# Patient Record
Sex: Male | Born: 1970 | Race: White | Hispanic: No | Marital: Single | Smoking: Never smoker
Health system: Southern US, Community
[De-identification: ages and names within clinical notes are randomized; demographics above are authoritative.]

## PROBLEM LIST (undated history)

## (undated) DIAGNOSIS — E291 Testicular hypofunction: Secondary | ICD-10-CM

## (undated) DIAGNOSIS — L309 Dermatitis, unspecified: Secondary | ICD-10-CM

## (undated) DIAGNOSIS — Z973 Presence of spectacles and contact lenses: Secondary | ICD-10-CM

## (undated) DIAGNOSIS — K219 Gastro-esophageal reflux disease without esophagitis: Secondary | ICD-10-CM

## (undated) DIAGNOSIS — R0602 Shortness of breath: Secondary | ICD-10-CM

## (undated) DIAGNOSIS — N529 Male erectile dysfunction, unspecified: Secondary | ICD-10-CM

## (undated) DIAGNOSIS — Z86711 Personal history of pulmonary embolism: Secondary | ICD-10-CM

## (undated) DIAGNOSIS — Z8739 Personal history of other diseases of the musculoskeletal system and connective tissue: Secondary | ICD-10-CM

## (undated) DIAGNOSIS — T7840XA Allergy, unspecified, initial encounter: Secondary | ICD-10-CM

## (undated) DIAGNOSIS — M1990 Unspecified osteoarthritis, unspecified site: Secondary | ICD-10-CM

## (undated) DIAGNOSIS — G8929 Other chronic pain: Secondary | ICD-10-CM

## (undated) DIAGNOSIS — Z86718 Personal history of other venous thrombosis and embolism: Secondary | ICD-10-CM

## (undated) DIAGNOSIS — I82409 Acute embolism and thrombosis of unspecified deep veins of unspecified lower extremity: Secondary | ICD-10-CM

## (undated) DIAGNOSIS — R202 Paresthesia of skin: Secondary | ICD-10-CM

## (undated) DIAGNOSIS — Z7901 Long term (current) use of anticoagulants: Secondary | ICD-10-CM

## (undated) HISTORY — DX: Paresthesia of skin: R20.2

## (undated) HISTORY — DX: Male erectile dysfunction, unspecified: N52.9

## (undated) HISTORY — PX: KNEE ARTHROSCOPY: SUR90

## (undated) HISTORY — DX: Testicular hypofunction: E29.1

## (undated) HISTORY — DX: Allergy, unspecified, initial encounter: T78.40XA

---
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
## 1998-08-06 ENCOUNTER — Ambulatory Visit (HOSPITAL_COMMUNITY): Admission: RE | Admit: 1998-08-06 | Discharge: 1998-08-06 | Payer: Self-pay

## 2000-02-28 ENCOUNTER — Ambulatory Visit (HOSPITAL_COMMUNITY): Admission: RE | Admit: 2000-02-28 | Discharge: 2000-02-28 | Payer: Self-pay | Admitting: Urology

## 2000-02-28 ENCOUNTER — Other Ambulatory Visit: Admission: RE | Admit: 2000-02-28 | Discharge: 2000-02-28 | Payer: Self-pay | Admitting: Urology

## 2000-02-28 ENCOUNTER — Encounter (INDEPENDENT_AMBULATORY_CARE_PROVIDER_SITE_OTHER): Payer: Self-pay | Admitting: Specialist

## 2004-07-24 HISTORY — PX: MENISECTOMY: SHX5181

## 2005-06-30 ENCOUNTER — Encounter: Admission: RE | Admit: 2005-06-30 | Discharge: 2005-06-30 | Payer: Self-pay | Admitting: Family Medicine

## 2005-07-24 DIAGNOSIS — Z86711 Personal history of pulmonary embolism: Secondary | ICD-10-CM

## 2005-07-24 HISTORY — DX: Personal history of pulmonary embolism: Z86.711

## 2005-08-23 ENCOUNTER — Inpatient Hospital Stay (HOSPITAL_COMMUNITY): Admission: AD | Admit: 2005-08-23 | Discharge: 2005-08-25 | Payer: Self-pay | Admitting: Orthopedic Surgery

## 2005-09-18 ENCOUNTER — Encounter: Admission: RE | Admit: 2005-09-18 | Discharge: 2005-09-18 | Payer: Self-pay | Admitting: Family Medicine

## 2006-01-18 ENCOUNTER — Ambulatory Visit: Payer: Self-pay | Admitting: Family Medicine

## 2006-02-05 ENCOUNTER — Ambulatory Visit: Payer: Self-pay | Admitting: Family Medicine

## 2006-03-02 ENCOUNTER — Ambulatory Visit: Payer: Self-pay | Admitting: Family Medicine

## 2006-10-22 ENCOUNTER — Ambulatory Visit: Payer: Self-pay | Admitting: Family Medicine

## 2006-11-15 ENCOUNTER — Ambulatory Visit: Payer: Self-pay | Admitting: Family Medicine

## 2006-11-30 ENCOUNTER — Ambulatory Visit: Payer: Self-pay | Admitting: Family Medicine

## 2007-06-18 ENCOUNTER — Ambulatory Visit: Payer: Self-pay | Admitting: Family Medicine

## 2007-06-24 ENCOUNTER — Encounter: Admission: RE | Admit: 2007-06-24 | Discharge: 2007-06-24 | Payer: Self-pay | Admitting: Family Medicine

## 2007-06-24 ENCOUNTER — Ambulatory Visit: Payer: Self-pay | Admitting: Family Medicine

## 2007-12-17 ENCOUNTER — Ambulatory Visit: Payer: Self-pay | Admitting: Family Medicine

## 2008-01-13 ENCOUNTER — Ambulatory Visit: Payer: Self-pay | Admitting: Family Medicine

## 2008-09-29 ENCOUNTER — Ambulatory Visit: Payer: Self-pay | Admitting: Family Medicine

## 2009-08-18 ENCOUNTER — Ambulatory Visit: Payer: Self-pay | Admitting: Family Medicine

## 2009-09-21 ENCOUNTER — Ambulatory Visit: Payer: Self-pay | Admitting: Family Medicine

## 2010-04-04 ENCOUNTER — Ambulatory Visit: Payer: Self-pay | Admitting: Family Medicine

## 2010-04-05 ENCOUNTER — Encounter: Admission: RE | Admit: 2010-04-05 | Discharge: 2010-04-05 | Payer: Self-pay | Admitting: Family Medicine

## 2010-04-12 ENCOUNTER — Ambulatory Visit: Payer: Self-pay | Admitting: Family Medicine

## 2010-09-01 ENCOUNTER — Ambulatory Visit (INDEPENDENT_AMBULATORY_CARE_PROVIDER_SITE_OTHER): Payer: Managed Care, Other (non HMO) | Admitting: Family Medicine

## 2010-09-01 DIAGNOSIS — J0100 Acute maxillary sinusitis, unspecified: Secondary | ICD-10-CM

## 2010-12-09 NOTE — Discharge Summary (Signed)
NAME:  Jonathan Archer, Jonathan Archer NO.:  [id number]   MEDICAL RECORD NO.:  [id number]          PATIENT TYPE:  INP   LOCATION:  5011                         FACILITY:  [facility]   PHYSICIAN:  Elliot Cousin, M.D.    DATE OF BIRTH:  [birth year]   DATE OF ADMISSION:  08/23/2005  DATE OF DISCHARGE:  08/25/2005                                 DISCHARGE SUMMARY   DISCHARGE DIAGNOSES:  1.  Left lower extremity deep venous thrombosis.  2.  Right upper lobe segmental pulmonary embolism per CT scan of the chest      on August 23, 2005.  3.  Status post arthroscopic knee surgery approximately 1 week ago for      treatment of a torn meniscus.  4.  Elevated homocystine level.   DISCHARGE MEDICATIONS:  1.  Lovenox 85 mg subcutaneous every 12 hours.  2.  Coumadin 5 mg 1 pill every evening at 6:00 plus a 2.5 mg p.o. every      evening at 6:00 for a total of 7.5 mg each evening.  3.  Multivitamin once daily.  4.  Percocet 5 mg every 6 hours as needed for pain.   DISCHARGE DISPOSITION:  The patient was discharged to home in improved and  stable condition on August 25, 2005. He has an appointment for blood work  at Dr. Jola Babinski office on Monday, August 28, 2005, at 10:15 a.m. He will  also follow up with Dr. Susann Givens in the office for hospital follow up on  August 02, 2005. He will follow up with Dr. Rennis Chris as previously scheduled.   PROCEDURES PERFORMED:  None during the hospital course, however, prior to  the hospital admission, an outpatient venous Doppler study revealed  extensive calf DVT in the posterior tibial and peroneal veins from the  distal to the proximal calf on August 23, 2005. Also, CT scan of the chest  with contrast performed on August 23, 2005 revealed right upper lobe  segmental pulmonary embolism.   HISTORY OF PRESENT ILLNESS:  The patient is a 40 year old man with a past  medical history significant for a torn left knee meniscus who presented to  Dr. Dub Mikes  office for a chief complaint of right-sided chest pain and  shortness of breath along with increased left leg pain and swelling. The  patient's orthopedic surgeon, Dr. Rennis Chris, ordered an outpatient CT scan of  the chest and a lower extremity venous Doppler study. The CT scan of the  chest was positive for PE and the venous Doppler study was positive for a  left lower extremity DVT. The patient was subsequently admitted by the  [facility] for further management.   HOSPITAL COURSE:  1.  PE/DVT. The patient was started on Lovenox 1 mg per kg subcu q.12h. and      Coumadin. The pharmacy staff was consulted for additional      recommendations regarding dosing of the anticoagulants. The patient's      INR and PT were monitored daily. The dose of Coumadin was adjusted      accordingly. The patient  was started on oxygen therapy as needed. The      patient's initial complaints of chest pain and shortness of breath      resolved. He did complain of intermittent left lower extremity pain      primarily with ambulation. The pain was less intensified at rest. As      needed, Vicodin and warm compresses were ordered for discomfort. A      homocystine level was assessed and found to be slightly elevated. The      patient was therefore started on folic acid once daily. He will be      discharged to home on one multivitamin daily. Over the course of the      hospitalization, the left knee became less swollen and less tender. His      INR on admission was 1 and his INR as of today is 1.2. The patient      received 7.5 mg of Coumadin yesterday and will receive 7.5 mg of      Coumadin today. In addition, he is being given 85 mg of Lovenox subcu      q.12h.   The patient requested ongoing outpatient treatment rather than staying in  the hospital. Given that the patient was hemodynamically stable throughout  the entire hospital course, it was deemed reasonable to discharge the  patient to  home on Lovenox and Coumadin therapy with close follow up with  Dr. Susann Givens. The patient's oxygen saturation ranged from 95% to 100% on room  air during the hospital course.  His blood pressures were well within normal  limits. He was relatively bradycardic; however, the patient is an athlete.   The patient was given instructions on Lovenox therapy by the registered  nurse and the pharmacist. The patient demonstrated excellent understanding  of the technique of self administration of Lovenox. He was advised to take  7.5 mg of Coumadin each evening. He was instructed to follow up with Dr.  Jola Babinski office in 3 days for reassessment of his PT and INR. The patient  will see Dr. Susann Givens in one week as well.  He will be treated for pain with  as needed Percocet. The patient was advised to not to return to work until  he follows up with his primary care physician in 1 week.      Elliot Cousin, M.D.  Electronically Signed     DF/MEDQ  D:  08/25/2005  T:  08/25/2005  Job:  [id number]   cc:   Sharlot Gowda, M.D.  Fax: [phone]   Vania Rea. Supple, M.D.  Fax: [phone]

## 2010-12-09 NOTE — H&P (Signed)
NAME:  Jonathan Archer, Jonathan Archer                 ACCOUNT NO.:  [id number]   MEDICAL RECORD NO.:  [id number]          PATIENT TYPE:  INP   LOCATION:  5011                         FACILITY:  [facility]   PHYSICIAN:  Lonia Blood, M.D.      DATE OF BIRTH:  1971-05-23   DATE OF ADMISSION:  08/23/2005  DATE OF DISCHARGE:                                HISTORY & PHYSICAL   PRIMARY CARE PHYSICIAN:  Sharlot Gowda, M.D.   PRESENTING COMPLAINT:  Right-sided chest pain and shortness of breath.   HISTORY OF PRESENT ILLNESS:  The patient is a 40 year old gentleman with no  significant past medical history except for some tendinitis.  The patient's  problem started last October when he injured his left knee.  He had an MRI  done in December that showed that he had torn meniscus.  He subsequently was  scheduled to have arthroscopic repair which was performed last week.  Postoperatively, the patient remained in bed for the most part at home for 6  days prior to returning to work.  On returning to work 2 days ago, the  patient started noticing pain in the left leg.  By yesterday, his leg was  getting swollen.  This morning he experienced sharp pain on the left side of  his chest.  Hence, he called his doctor's office.  He was sent over to get a  chest CT which showed PE.  He was subsequently sent to have lower extremity  Dopplers and admitted to the hospital for further management.  The patient  currently denied any chest pain.  No shortness of breath. No cough.  He  denied any fever.  His leg is painful but otherwise no other complaints.  He  denied any PND, orthopnea, and no diaphoresis.   PAST MEDICAL HISTORY:  Mainly that of tendinitis.   ALLERGIES:  The patient has no known drug allergies.   SOCIAL HISTORY:  The patient is divorced.  He has 3 kids, all boys, ages 32,  34, and 27.  Denied any tobacco use.  Occasionally drinks alcohol.  He works  for a [employer].  He is active otherwise.  The patient  has had 3  previous knee surgeries, the last one about 10 years ago, but never had any  problem.   FAMILY HISTORY:  Mom is in her 11s and dad also in the 27s, no medical  problems the patient can remember.  On his mom's side, history of diabetes  and hypertension.   REVIEW OF SYSTEMS:  A 10-point Review of Systems is performed and  essentially ash in HPI.   PHYSICAL EXAMINATION:  VITAL SIGNS:  On his exam today, the patient is  afebrile with temperature 97.6, blood pressure 123/77, pulse 58, respiratory  rate 16, O2 saturation 97% on room air.  GENERAL: The patient is alert and oriented in no acute distress and very  conversant.  HEENT: PERRLA.  EOMI.  NECK:  Supple.  No JVD, no lymphadenopathy.  RESPIRATORY:  Good air entry bilaterally.  No wheeze, no rales.  CARDIOVASCULAR: The patient has  regular rate and rhythm, no murmurs.  ABDOMEN:  Soft, nontender, with positive bowel sounds.  EXTREMITIES:  Left lower extremity shows mild swelling and warmth at the  calf region, otherwise no significant edema, cyanosis, or clubbing. The  patient has 2+ DP pulses.  NEUROLOGIC:  Exam was essentially nonfocal.   LABORATORY DATA:  Labs are currently pending.   Chest CT without contrast performed today, August 23, 2005, showed left  lung segmental pulmonary embolus.   Right lower extremity Dopplers also performed today showed extensive calf  DVT in the posterior tibial and peroneal veins from distal to proximal calf.   ASSESSMENT:  This is a young man, 40 years old, presenting with right lung  segmental pulmonary embolus arising from left calf deep vein thrombosis.  This seems to be a trick with deep vein thrombosis and pulmonary embolus,  especially with recent surgery.  The patient had no family history of  thromboembolic phenomena, and I doubt if this is genetic.   PLAN:  1.  PE: Will admit the patient and start him on anticoagulation.  The      patient is young and has family support,  and they are willing to take      care of Lovenox injections at home.  So I will put him on Lovenox 1      mg/kg subcutaneously twice daily.  We will trend patient, and hopefully      in a day or two, once we have a general idea of what his INR is, we will      send patient home with Lovenox.  In the meantime, we will also start him      on Coumadin concurrently and follow his PT/INR.  We will defer genetic      tests until after 6 months on Coumadin.  The patient can be taken off      Coumadin at that point.  Two weeks later, he can get blood work for      protein S and C.  We can also check factor V Leiden.  We can check lupus      anticoagulant, etc.  I will, however, go ahead and check a homocysteine      level as well.  2.  DVT: We will treat it the same way as the PE above.  We will try and      treat for possible Post-Phlebetic syndrome if it occurs.  In the      meantime, we may elevate the foot and try warm compress if needed.  3.  Status post arthroscopic knee repair: Once the patient is fully      anticoagulated, we may be able to resume his PT on the knee.  We will      keep him off his knee at this point.   Other measures will depend on initial results once we get the labs back.      Lonia Blood, M.D.  Electronically Signed     LG/MEDQ  D:  08/23/2005  T:  08/23/2005  Job:  [id number]   cc:   Sharlot Gowda, M.D.  Fax: [phone]

## 2011-03-03 ENCOUNTER — Encounter: Payer: Self-pay | Admitting: Family Medicine

## 2011-05-01 ENCOUNTER — Encounter: Payer: Self-pay | Admitting: Family Medicine

## 2011-05-01 ENCOUNTER — Ambulatory Visit (INDEPENDENT_AMBULATORY_CARE_PROVIDER_SITE_OTHER): Payer: Managed Care, Other (non HMO) | Admitting: Family Medicine

## 2011-05-01 VITALS — BP 120/70 | HR 47 | Ht 70.0 in | Wt 205.0 lb

## 2011-05-01 DIAGNOSIS — L723 Sebaceous cyst: Secondary | ICD-10-CM

## 2011-05-01 DIAGNOSIS — L089 Local infection of the skin and subcutaneous tissue, unspecified: Secondary | ICD-10-CM

## 2011-05-01 NOTE — Patient Instructions (Signed)
Keep the area clean and dry. Return here Wednesday for packing removal

## 2011-05-01 NOTE — Progress Notes (Signed)
  Subjective:    Patient ID: Jonathan Archer, male    DOB: Feb 05, 1971, 40 y.o.   MRN: [id number]  HPI He has a several day history of swelling and pain as well as drainage from a lesion on his mid upper back area. He had difficulty with this 3 years ago.   Review of Systems     Objective:   Physical Exam A 3 cm raised red tender lesion with central drainage is noted in the mid back area       Assessment & Plan:  Infected sebaceous cyst The wound was injected with Xylocaine. An I+D was performed without difficulty. Purulent material was expressed. The cyst sac was incised however there is a question of whether there still some material present. Was packed without form. He will return here in 2 days for packing removal.

## 2011-05-03 ENCOUNTER — Encounter: Payer: Self-pay | Admitting: Family Medicine

## 2011-05-03 ENCOUNTER — Ambulatory Visit (INDEPENDENT_AMBULATORY_CARE_PROVIDER_SITE_OTHER): Payer: Managed Care, Other (non HMO) | Admitting: Family Medicine

## 2011-05-03 VITALS — BP 110/70 | HR 50 | Wt 203.0 lb

## 2011-05-03 DIAGNOSIS — L02212 Cutaneous abscess of back [any part, except buttock]: Secondary | ICD-10-CM

## 2011-05-03 DIAGNOSIS — L02219 Cutaneous abscess of trunk, unspecified: Secondary | ICD-10-CM

## 2011-05-03 NOTE — Progress Notes (Signed)
  Subjective:    Patient ID: Jonathan Archer, male    DOB: September 11, 1970, 40 y.o.   MRN: [id number]  HPI He is here for recheck on recent I+D of an abscess.   Review of Systems     Objective:   Physical Exam The wound seems to be healing nicely. Packing was removed.       Assessment & Plan:  I and D. of infected cyst Heat the area clean and irrigate this and you daily.

## 2011-05-08 ENCOUNTER — Encounter: Payer: Self-pay | Admitting: Family Medicine

## 2011-05-08 ENCOUNTER — Ambulatory Visit (INDEPENDENT_AMBULATORY_CARE_PROVIDER_SITE_OTHER): Payer: Managed Care, Other (non HMO) | Admitting: Family Medicine

## 2011-05-08 VITALS — BP 120/80 | HR 60 | Wt 201.0 lb

## 2011-05-08 DIAGNOSIS — S9032XA Contusion of left foot, initial encounter: Secondary | ICD-10-CM

## 2011-05-08 DIAGNOSIS — S9030XA Contusion of unspecified foot, initial encounter: Secondary | ICD-10-CM

## 2011-05-08 NOTE — Progress Notes (Signed)
  Subjective:    Patient ID: Jonathan Archer, male    DOB: [birth year], 40 y.o.   MRN: [id number]  HPI He injured his left fifth toe while playing soccer over the weekend. It is now giving him difficulty with walking.   Review of Systems     Objective:   Physical Exam Left fifth toe is tender to palpation as well as motion. The x-ray is negative.      Assessment & Plan:  Foot contusion Supportive care including Advil and using hard soled shoe.

## 2011-05-08 NOTE — Patient Instructions (Signed)
Use Advil for pain relief. Use a hard soled shoe or even a wooden shoe if you have one. If you keep having trouble, come back

## 2011-05-25 ENCOUNTER — Other Ambulatory Visit: Payer: Self-pay | Admitting: Family Medicine

## 2011-05-26 NOTE — Telephone Encounter (Signed)
Is this okay to refill? 

## 2011-08-14 ENCOUNTER — Encounter: Payer: Self-pay | Admitting: Family Medicine

## 2011-08-14 ENCOUNTER — Ambulatory Visit (INDEPENDENT_AMBULATORY_CARE_PROVIDER_SITE_OTHER): Payer: Managed Care, Other (non HMO) | Admitting: Family Medicine

## 2011-08-14 VITALS — BP 112/70 | HR 68 | Ht 70.0 in | Wt 204.0 lb

## 2011-08-14 DIAGNOSIS — M79609 Pain in unspecified limb: Secondary | ICD-10-CM

## 2011-08-14 DIAGNOSIS — M79671 Pain in right foot: Secondary | ICD-10-CM

## 2011-08-14 MED ORDER — TRAMADOL HCL 50 MG PO TABS: 50.0000 mg | ORAL_TABLET | Freq: Four times a day (QID) | ORAL | Status: AC | PRN

## 2011-08-14 NOTE — Progress Notes (Signed)
  Subjective:    Patient ID: Jonathan Archer, male    DOB: [birth year], 41 y.o.   MRN: [id number]  HPI He injured his right foot playing recreational soccer yesterday. He describes an injury that involved internal rotation of the foot while shifting position. He felt a popping sensation in the plantar fascia area. He was unable to walk on it after that and notes discoloration today. He has been wearing a boot with some success in limiting the pain . He does have a history of previous injury approximately 5 weeks before this to the same . This again was while playing soccer.   Review of Systems     Objective:   Physical Exam  tender to palpation and discoloration noted to the proximal plantar fascia area. No tenderness over the calcaneal spur. Good motion of the ankle. No bony tenderness noted.      Assessment & Plan   Plantar fascia sprain. He is to use the boot to help protect this area. Tramadol also given. He is to return here in 2 weeks for recheck.

## 2011-08-14 NOTE — Patient Instructions (Signed)
Use the boot for comfort for the next week or 2. See you back here in 2 weeks

## 2011-08-28 ENCOUNTER — Ambulatory Visit (INDEPENDENT_AMBULATORY_CARE_PROVIDER_SITE_OTHER): Payer: Managed Care, Other (non HMO) | Admitting: Family Medicine

## 2011-08-28 ENCOUNTER — Encounter: Payer: Self-pay | Admitting: Family Medicine

## 2011-08-28 VITALS — BP 120/70 | HR 78 | Ht 70.0 in | Wt 202.0 lb

## 2011-08-28 DIAGNOSIS — G8929 Other chronic pain: Secondary | ICD-10-CM

## 2011-08-28 DIAGNOSIS — M79609 Pain in unspecified limb: Secondary | ICD-10-CM

## 2011-08-28 DIAGNOSIS — M25559 Pain in unspecified hip: Secondary | ICD-10-CM

## 2011-08-28 DIAGNOSIS — M79671 Pain in right foot: Secondary | ICD-10-CM

## 2011-08-28 MED ORDER — NAPROXEN 500 MG PO TABS: 500.0000 mg | ORAL_TABLET | Freq: Two times a day (BID) | ORAL | Status: AC

## 2011-08-28 NOTE — Patient Instructions (Signed)
Start wearing regular shoes and to make sure you try to walk with a normal gait. Try the Naprosyn for the pain. We will get an x-ray whenever we need to.

## 2011-08-28 NOTE — Progress Notes (Signed)
  Subjective:    Patient ID: Jonathan Archer, male    DOB: August 20, 1970, 41 y.o.   MRN: [id number]  HPI He is here for recheck. He wore regular shoes today and did note some slight increase in his discomfort in the arch of the foot. Also he has noted an increase in left hip pain since he's been using the walker. He has a previous history of arthritis of the left hip however he has been able to remain fairly active.   Review of Systems     Objective:   Physical Exam Alert and in no distress. Hip motion in all directions because of discomfort. Exam of the foot does show some slight tenderness over the plantar fascia several centimeters distal to the calcaneal spur.      Assessment & Plan:   1. Chronic left hip pain   2. Right foot pain    recommend he slowly increased his physical activity for the foot based on pain. Discussed walking without pain and then has to be done. He then cutting before returning to physical activity. Discussed care for the hip pain. We'll give Naprosyn. Discussed the fact that eventually will need another x-ray and probable referral to orthopedics.

## 2011-09-05 ENCOUNTER — Telehealth: Payer: Self-pay | Admitting: Family Medicine

## 2011-09-05 MED ORDER — SILDENAFIL CITRATE 100 MG PO TABS: 100.0000 mg | ORAL_TABLET | ORAL | Status: DC | PRN

## 2011-09-05 NOTE — Telephone Encounter (Signed)
Pt called he has found that Viagra is cheaper at [facility] on [address] and would like the rx to be placed there.  $8.00 per pill cheaper.

## 2011-09-05 NOTE — Telephone Encounter (Signed)
Viagra renewed at a different pharmacy

## 2011-09-14 ENCOUNTER — Encounter: Payer: Self-pay | Admitting: Family Medicine

## 2011-09-14 ENCOUNTER — Ambulatory Visit (INDEPENDENT_AMBULATORY_CARE_PROVIDER_SITE_OTHER): Payer: Managed Care, Other (non HMO) | Admitting: Family Medicine

## 2011-09-14 VITALS — BP 120/80 | HR 68 | Ht 70.0 in | Wt 203.0 lb

## 2011-09-14 DIAGNOSIS — M1610 Unilateral primary osteoarthritis, unspecified hip: Secondary | ICD-10-CM

## 2011-09-14 HISTORY — DX: Unilateral primary osteoarthritis, unspecified hip: M16.10

## 2011-09-14 NOTE — Patient Instructions (Signed)
I will call you with the results of the xray 

## 2011-09-14 NOTE — Progress Notes (Signed)
  Subjective:    Patient ID: Jonathan Archer, male    DOB: [birth year], 41 y.o.   MRN: [id number]  HPI  he is here for continued difficulty with left hip pain. He is now having more difficulty with his ADLs he notes pain now he gets up out of a chair. The pain can be transient but does cause radiation down his leg. He had a previous x-ray in 2011 was to show some mild changes. He has tried various over-the-counter medications and tramadol none of which give him adequate control of his symptoms.   Review of Systems     Objective:   Physical Exam Alert and in no distress. He does have limitation of internal as well as external rotation. Also flexion and extension cause slight discomfort.       Assessment & Plan:   1. Arthritis of hip  DG Hip Complete Left, Ambulatory referral to Orthopedic Surgery   I explained to him the fact that since his the pain is worse, there is a very real possibility that surgery will be needed or at the very least possible injection.

## 2011-09-15 ENCOUNTER — Ambulatory Visit
Admission: RE | Admit: 2011-09-15 | Discharge: 2011-09-15 | Disposition: A | Discharge status: Home / Self Care | Payer: Managed Care, Other (non HMO) | Source: Ambulatory Visit | Attending: Family Medicine | Admitting: Family Medicine

## 2011-09-15 ENCOUNTER — Telehealth: Payer: Self-pay | Admitting: Family Medicine

## 2011-09-15 DIAGNOSIS — M1610 Unilateral primary osteoarthritis, unspecified hip: Secondary | ICD-10-CM

## 2011-09-15 NOTE — Telephone Encounter (Signed)
Pt states he is unable to get an appointment with specialist until April 23.  Pt wants to know what can he do in the meantime. Please call pt

## 2011-09-16 NOTE — Telephone Encounter (Signed)
Get him in with someone else sooner

## 2011-09-18 NOTE — Telephone Encounter (Signed)
Faxed referral to [facility]

## 2011-10-02 ENCOUNTER — Telehealth: Payer: Self-pay | Admitting: Family Medicine

## 2011-10-02 NOTE — Telephone Encounter (Signed)
Time for him to see an orthopedic surgeon and find out who his preferences and refer him

## 2011-10-03 NOTE — Telephone Encounter (Signed)
They will see him for both today

## 2012-08-22 ENCOUNTER — Ambulatory Visit (INDEPENDENT_AMBULATORY_CARE_PROVIDER_SITE_OTHER): Payer: Managed Care, Other (non HMO) | Admitting: Family Medicine

## 2012-08-22 ENCOUNTER — Ambulatory Visit
Admission: RE | Admit: 2012-08-22 | Discharge: 2012-08-22 | Disposition: A | Discharge status: Home / Self Care | Payer: Managed Care, Other (non HMO) | Source: Ambulatory Visit | Attending: Family Medicine | Admitting: Family Medicine

## 2012-08-22 ENCOUNTER — Encounter: Payer: Self-pay | Admitting: Family Medicine

## 2012-08-22 VITALS — BP 120/80 | HR 60 | Wt 196.0 lb

## 2012-08-22 DIAGNOSIS — M1610 Unilateral primary osteoarthritis, unspecified hip: Secondary | ICD-10-CM

## 2012-08-22 MED ORDER — TRAMADOL HCL 50 MG PO TABS: 50.0000 mg | ORAL_TABLET | Freq: Three times a day (TID) | ORAL | Status: DC | PRN

## 2012-08-22 NOTE — Progress Notes (Signed)
  Subjective:    Patient ID: Jonathan Archer, male    DOB: 1971-04-05, 42 y.o.   MRN: [id number]  HPI He is here for evaluation of left hip pain. He has been playing with your lead soccer and did have a game 4 days ago. 3 days ago he woke up with increasing hip pain. Last night the hip pain that woke him up in the middle of the night. He has been taking ibuprofen. He has taken in the past tramadol which did help. X-rays from February of last year did show mild to moderate arthritis.   Review of Systems     Objective:   Physical Exam Pain and limitation of motion with internal as well as external rotation of the hip is noted.       Assessment & Plan:   1. Arthritis, hip  DG Hip Complete Left, traMADol (ULTRAM) 50 MG tablet   He is aware of the fact that he will eventually need hip replacement.

## 2012-09-06 ENCOUNTER — Other Ambulatory Visit: Payer: Self-pay | Admitting: Family Medicine

## 2012-09-06 NOTE — Telephone Encounter (Signed)
Is this ok?

## 2012-11-11 ENCOUNTER — Encounter: Payer: Self-pay | Admitting: Medical

## 2012-11-11 ENCOUNTER — Ambulatory Visit (INDEPENDENT_AMBULATORY_CARE_PROVIDER_SITE_OTHER): Payer: Managed Care, Other (non HMO) | Admitting: Medical

## 2012-11-11 VITALS — BP 118/70 | HR 62 | Temp 97.8°F | Resp 16 | Wt 194.0 lb

## 2012-11-11 DIAGNOSIS — L259 Unspecified contact dermatitis, unspecified cause: Secondary | ICD-10-CM

## 2012-11-11 DIAGNOSIS — L309 Dermatitis, unspecified: Secondary | ICD-10-CM

## 2012-11-11 NOTE — Patient Instructions (Signed)
Dermatitis of face  Begin Aquafor cream 2-3 times daily for a week.  You can also do Benadryl at bedtime orally (OTC).  If not improving in 1 week, add topical Cortaid/Hydrocortisone cream OTC.  If this still isn't helping after 7-10 days, then let me know.

## 2012-11-11 NOTE — Progress Notes (Signed)
Subjective: Here for 4-6 mo hx/o rash on forehead, right temple, and right eyebrow.  Slight redness, at times irritating, but no frank burning, itching.  Using nothing on the rash.   No specific contacts.  He works at [employer], been there 25 years.  No other contacts, chemical or other exposure.    ROS as in subjective  Objective:  Gen: wd, wn, nad Skin: faint patches of erythema central forehead, right forehead/temple region, small patch above right eyebrow.  Lots of freckles.  No specific worrisome lesions  Assessment: Encounter Diagnosis  Name Primary?  . Dermatitis of face Yes   Plan: Etiology unclear.  No obvious rosacea or cancerous lesion, no obvious lesion suggestive basal or squamous cell.  Etiology seems more of a contact or other dermatitis.  Begin Aquaphor x 1wk, and if not improvement, then use 7-10 days of Cortaid OTC.  If not improving, refer to dermatology.

## 2012-12-13 ENCOUNTER — Ambulatory Visit (INDEPENDENT_AMBULATORY_CARE_PROVIDER_SITE_OTHER): Payer: Managed Care, Other (non HMO) | Admitting: Family Medicine

## 2012-12-13 ENCOUNTER — Encounter: Payer: Self-pay | Admitting: Family Medicine

## 2012-12-13 VITALS — BP 100/60 | HR 78 | Wt 182.0 lb

## 2012-12-13 DIAGNOSIS — L259 Unspecified contact dermatitis, unspecified cause: Secondary | ICD-10-CM

## 2012-12-13 DIAGNOSIS — L02212 Cutaneous abscess of back [any part, except buttock]: Secondary | ICD-10-CM

## 2012-12-13 DIAGNOSIS — H538 Other visual disturbances: Secondary | ICD-10-CM

## 2012-12-13 DIAGNOSIS — L02219 Cutaneous abscess of trunk, unspecified: Secondary | ICD-10-CM

## 2012-12-13 DIAGNOSIS — L309 Dermatitis, unspecified: Secondary | ICD-10-CM

## 2012-12-13 NOTE — Progress Notes (Signed)
  Subjective:    Patient ID: Jonathan Archer, male    DOB: 1971-06-01, 42 y.o.   MRN: [id number]  HPI Has had 2 episodes of visual changes that he describes as the vision going away peripherally with central vision. The symptoms lasted roughly 20 minutes. He had no double vision, weakness, heart rate changes,sweating, hunger sensation. He did note that he did not eat his normal regimen that day. He also was treating a rash on his forehead and right face with steroids but was concerned about using it for an extended period time. He also has an abscess on his back and that spontaneously drained. He has had this opened twice in the past.   Review of Systems     Objective:   Physical Exam alert and in no distress. Tympanic membranes and canals are normal. Throat is clear. Tonsils are normal. Neck is supple without adenopathy or thyromegaly. Cardiac exam shows a regular sinus rhythm without murmurs or gallops. Lungs are clear to auscultation. Exam of his face does show slight erythema at the hairline just right of midline as well as an area of slight erythema in the right preauricular area. Exam of his back does show a healing lesion that I was able to express a small amount of purulent fluid from.       Assessment & Plan:  Abscess of back  Dermatitis  Blurred vision, bilateral recommend he return here in the future if the lesion on his back becomes red and inflamed again. Recommend he use cortisone cream on the facial lesions for the next several weeks and if no improvement, will refer. Discussed the fact that the visual changes most likely are related to low blood sugar and recommended eating regular meals and having a snack around. If this does not help, further evaluation will be needed.

## 2013-06-18 ENCOUNTER — Ambulatory Visit (INDEPENDENT_AMBULATORY_CARE_PROVIDER_SITE_OTHER): Payer: Managed Care, Other (non HMO) | Admitting: Family Medicine

## 2013-06-18 ENCOUNTER — Encounter: Payer: Self-pay | Admitting: Family Medicine

## 2013-06-18 VITALS — BP 110/70 | HR 52 | Wt 178.0 lb

## 2013-06-18 DIAGNOSIS — L309 Dermatitis, unspecified: Secondary | ICD-10-CM

## 2013-06-18 DIAGNOSIS — B351 Tinea unguium: Secondary | ICD-10-CM

## 2013-06-18 DIAGNOSIS — L259 Unspecified contact dermatitis, unspecified cause: Secondary | ICD-10-CM

## 2013-06-18 MED ORDER — TERBINAFINE HCL 250 MG PO TABS: 250.0000 mg | ORAL_TABLET | Freq: Every day | ORAL | Status: DC

## 2013-06-18 NOTE — Progress Notes (Signed)
   Subjective:    Patient ID: Jonathan Archer, male    DOB: 02-Sep-1970, 42 y.o.   MRN: [id number]  HPI He is here for evaluation of lesion present in the mid forehead along the hairline that continues to become scaly. He also has one on the base of the penis and has lesions on his right forearm. He uses cortisone cream which helps them go away but they come back. He also continues had difficulty with toenail fungus and would now like to be treated since they're starting to interfere with him being able to him his nails.   Review of Systems     Objective:   Physical Exam Alert and in no distress. Exam of the mid forehead area does show slight erythema and dryness with some scaling approximately 2 cm in size. He also has scattered nondescript lesion on his right forearm. The base of the penis does show slight erythema and drying 1 x 2 cm.       Assessment & Plan:  Eczema  Onychomycosis - Plan: terbinafine (LAMISIL) 250 MG tablet  explained that the skin lesions are nonspecific dermatitis type of issue and continue to use the cortisone and Donnie Aho has ear is clear and then back off and use it intermittently to keep it under control. Did discuss possibly referring him to dermatology however he's comfortable with this approach. He will start treating his toes with Lamisil. Return here in 2 months for recheck and blood work.

## 2014-02-18 ENCOUNTER — Ambulatory Visit (INDEPENDENT_AMBULATORY_CARE_PROVIDER_SITE_OTHER): Payer: Managed Care, Other (non HMO) | Admitting: Family Medicine

## 2014-02-18 ENCOUNTER — Encounter: Payer: Self-pay | Admitting: Family Medicine

## 2014-02-18 VITALS — BP 110/70 | HR 46 | Wt 184.0 lb

## 2014-02-18 DIAGNOSIS — Z6379 Other stressful life events affecting family and household: Secondary | ICD-10-CM

## 2014-02-18 DIAGNOSIS — Z638 Other specified problems related to primary support group: Secondary | ICD-10-CM

## 2014-02-18 DIAGNOSIS — R112 Nausea with vomiting, unspecified: Secondary | ICD-10-CM

## 2014-02-18 LAB — CBC WITH DIFFERENTIAL/PLATELET
BASOS ABS: 0 10*3/uL (ref 0.0–0.1)
BASOS PCT: 0 % (ref 0–1)
EOS ABS: 0.1 10*3/uL (ref 0.0–0.7)
Eosinophils Relative: 1 % (ref 0–5)
HEMATOCRIT: 44.2 % (ref 39.0–52.0)
HEMOGLOBIN: 15.6 g/dL (ref 13.0–17.0)
Lymphocytes Relative: 26 % (ref 12–46)
Lymphs Abs: 1.6 10*3/uL (ref 0.7–4.0)
MCH: 29.9 pg (ref 26.0–34.0)
MCHC: 35.3 g/dL (ref 30.0–36.0)
MCV: 84.8 fL (ref 78.0–100.0)
MONO ABS: 0.5 10*3/uL (ref 0.1–1.0)
MONOS PCT: 8 % (ref 3–12)
NEUTROS ABS: 3.9 10*3/uL (ref 1.7–7.7)
NEUTROS PCT: 65 % (ref 43–77)
Platelets: 223 10*3/uL (ref 150–400)
RBC: 5.21 MIL/uL (ref 4.22–5.81)
RDW: 14.2 % (ref 11.5–15.5)
WBC: 6 10*3/uL (ref 4.0–10.5)

## 2014-02-18 LAB — COMPREHENSIVE METABOLIC PANEL
ALK PHOS: 58 U/L (ref 39–117)
ALT: 20 U/L (ref 0–53)
AST: 18 U/L (ref 0–37)
Albumin: 4.3 g/dL (ref 3.5–5.2)
BILIRUBIN TOTAL: 0.4 mg/dL (ref 0.2–1.2)
BUN: 13 mg/dL (ref 6–23)
CO2: 25 mEq/L (ref 19–32)
CREATININE: 1.26 mg/dL (ref 0.50–1.35)
Calcium: 9.4 mg/dL (ref 8.4–10.5)
Chloride: 103 mEq/L (ref 96–112)
GLUCOSE: 97 mg/dL (ref 70–99)
Potassium: 4.4 mEq/L (ref 3.5–5.3)
Sodium: 136 mEq/L (ref 135–145)
Total Protein: 6.8 g/dL (ref 6.0–8.3)

## 2014-02-18 LAB — HEMOCCULT GUIAC POC 1CARD (OFFICE)

## 2014-02-18 MED ORDER — ONDANSETRON HCL 4 MG PO TABS: 4.0000 mg | ORAL_TABLET | Freq: Three times a day (TID) | ORAL | Status: DC | PRN

## 2014-02-18 NOTE — Patient Instructions (Signed)
Take 2 Prilosec today and continue that and I will let you know about the blood work. Use the nausea medicine as needed

## 2014-02-18 NOTE — Progress Notes (Signed)
   Subjective:    Patient ID: Jonathan Archer, male    DOB: 10/19/1970, 43 y.o.   MRN: [id number]  HPI He has a one-month history of nausea and occasional vomiting especially if he brushes his teeth. As the day goes on he does get better but still has nausea. He does note that he has trouble eating lunch but dinner is not an issue. He has noted increase in his symptoms in the last several days but no diarrhea. He has noted a decrease in his BMs since not eating. His stools have been normal in color and character .He has not tried any OTC meds. Also of note is family stress. He states that his oldest son is about to go to jail for nonpayment of legal issues. His 43 year old son recently got his girlfriend pregnant. The youngest son is having difficulty with his ex-wife. All these have stressed him significantly. His work is going well.  Review of Systems     Objective:   Physical Exam alert and in no distress. Tympanic membranes and canals are normal. Throat is clear. Tonsils are normal. Neck is supple without adenopathy or thyromegaly. Cardiac exam shows a regular sinus rhythm without murmurs or gallops. Lungs are clear to auscultation. Abdominal exam shows decreased bowel sounds without masses or tenderness        Assessment & Plan:  Non-intractable vomiting with nausea, vomiting of unspecified type - Plan: ondansetron (ZOFRAN) 4 MG tablet, CBC with Differential, Comprehensive metabolic panel, POCT occult blood stool  Stressful life event affecting family  Stress could be playing a role with his nausea however I think you need to rule out other issues. I will use Zofran initially. Also discussed the stress and he is under. He seems to be handling this fairly well. It essentially gave him a chance to ventilate which I think helped. Did recommend the use of Prilosec at 40 mg.

## 2014-04-22 ENCOUNTER — Telehealth: Payer: Self-pay | Admitting: Internal Medicine

## 2014-04-22 NOTE — Telephone Encounter (Signed)
Fax[facility] over medical records to [facility] @ [phone] on 04/15/14

## 2014-08-06 ENCOUNTER — Encounter: Payer: Self-pay | Admitting: Family Medicine

## 2014-08-06 ENCOUNTER — Ambulatory Visit (INDEPENDENT_AMBULATORY_CARE_PROVIDER_SITE_OTHER): Payer: BLUE CROSS/BLUE SHIELD | Admitting: Family Medicine

## 2014-08-06 VITALS — BP 124/80 | HR 60

## 2014-08-06 DIAGNOSIS — L309 Dermatitis, unspecified: Secondary | ICD-10-CM

## 2014-08-06 NOTE — Progress Notes (Signed)
   Subjective:    Patient ID: Jonathan Archer, male    DOB: 12/03/1970, 44 y.o.   MRN: [id number]  HPI He complains of difficulty with redness and slight itching along the hairline on his forehead. He says it responds to steroids but however when he stops it comes back again. He also has a lesion on his right lower flank area that is slightly erythematous and red. He does not respond as well to the steroids.   Review of Systems     Objective:   Physical Exam Slight erythema is noted at the hairline on his forehead. The eyebrows and postauricular area are normal. No other lesions are noted except on the right flank which shows a 1-1/2 cm slightly erythematous just dry round lesion.       Assessment & Plan:  Dermatitis  I explained that this could be early seborrheic dermatitis but I was not sure. Lesion on the flank is questionable. He would like a referral to dermatology.

## 2014-08-07 ENCOUNTER — Telehealth: Payer: Self-pay | Admitting: Family Medicine

## 2014-08-07 ENCOUNTER — Telehealth: Payer: Self-pay | Admitting: Internal Medicine

## 2014-08-07 NOTE — Telephone Encounter (Signed)
error 

## 2014-08-07 NOTE — Telephone Encounter (Signed)
Pt has an appt scheduled at [facility]  On Thursday February 4th with PA Devin Goingena Andersen @ 11:20pm. Wilmon ArmsArrive at 11:00am. Pt is aware of appt.

## 2014-11-02 ENCOUNTER — Ambulatory Visit (INDEPENDENT_AMBULATORY_CARE_PROVIDER_SITE_OTHER): Payer: BLUE CROSS/BLUE SHIELD | Admitting: Family Medicine

## 2014-11-02 VITALS — BP 118/76 | HR 60 | Wt 175.0 lb

## 2014-11-02 DIAGNOSIS — S63601A Unspecified sprain of right thumb, initial encounter: Secondary | ICD-10-CM | POA: Diagnosis not present

## 2014-11-02 NOTE — Progress Notes (Signed)
   Subjective:    Patient ID: Jonathan FriarDavid P Lackman, male    DOB: 06/16/1971, 44 y.o.   MRN: [id number]  HPI He is here for evaluation of a one-month history of right thumb pain at the base. He has a history of starting an exercise program that included pushups approximately 2-1/2 months ago. One month ago he did a lot of yard work using his hands and did no pain at the base of his thumb. He has tried conservative measures including using a splint but found continued difficulty. He is left-handed.   Review of Systems     Objective:   Physical Exam Full motion of the thumb. Slight tenderness at the MCP joint. There is also tenderness at the insertion of the opponens and flexor brevis.       Assessment & Plan:  Thumb sprain, right, initial encounter discussed options with him. Recommend he use the splint in a more anatomic position. Explained that his hand should he in the neutral position. The way he explained using his blood indicated that he actually had his thumb more extended than necessary. If he continues have difficulty, I will refer to hand surgeon.

## 2014-11-11 ENCOUNTER — Telehealth: Payer: Self-pay | Admitting: Family Medicine

## 2014-11-11 NOTE — Telephone Encounter (Signed)
Pt stopped by and stated that he needs referral for his hand. He states that the brace is not helping at all. He sees Dr. Amanda [facility]Gramig at [facility]. Pt can be reached at  [phone].

## 2014-11-11 NOTE — Telephone Encounter (Signed)
Called and left detailed message that pt is scheduled with Dr. Penni BombardKendall on 11/20/14 @8am  at [facility]- [address], phone # is [phone]/. I sent form in to get him in with Dr. Amanda PeaGramig but i am guessing that he was booked and Dr. Penni BombardKendall was first available.

## 2014-11-11 NOTE — Telephone Encounter (Signed)
Faxed over referral form to [facility]

## 2014-11-11 NOTE — Telephone Encounter (Signed)
Refer him

## 2014-11-13 ENCOUNTER — Telehealth: Payer: Self-pay | Admitting: Family Medicine

## 2014-11-13 NOTE — Telephone Encounter (Signed)
Please call  Girlfriend has vaginitis and he wants to know if he needs to be seen or watch for anything. He is not having any symptons

## 2014-11-13 NOTE — Telephone Encounter (Signed)
Depends on what, vaginitis she has as to what his risk is. Have him find out

## 2014-11-13 NOTE — Telephone Encounter (Signed)
He is going to find out some specifics and call back.

## 2014-11-23 ENCOUNTER — Telehealth: Payer: Self-pay | Admitting: Family Medicine

## 2014-11-23 NOTE — Telephone Encounter (Signed)
Pt.notified

## 2014-11-23 NOTE — Telephone Encounter (Signed)
Pt having calf, thigh, groin area pain. He made an appointment for 8:00 in the morning but wants to know if Dr Susann GivensLalonde would rather that he go get an ultrasound or something first and reschedule appt with Dr Susann GivensLalonde given pt's history of clots.

## 2014-11-23 NOTE — Telephone Encounter (Signed)
Let's start with an appointment here first

## 2014-11-24 ENCOUNTER — Ambulatory Visit (INDEPENDENT_AMBULATORY_CARE_PROVIDER_SITE_OTHER): Payer: BLUE CROSS/BLUE SHIELD | Admitting: Family Medicine

## 2014-11-24 ENCOUNTER — Encounter: Payer: Self-pay | Admitting: Family Medicine

## 2014-11-24 VITALS — BP 128/72 | HR 44 | Wt 176.0 lb

## 2014-11-24 DIAGNOSIS — M79605 Pain in left leg: Secondary | ICD-10-CM | POA: Diagnosis not present

## 2014-11-24 NOTE — Progress Notes (Signed)
   Subjective:    Patient ID: Jonathan FriarDavid P Grisby, male    DOB: 05/25/1971, 44 y.o.   MRN: [id number]  HPI He comes in for evaluation of left leg pain. Several days ago he noted some medial calf pain progressed to the medial thigh and into the groin area over this time he is having no difficulty. He did complain of some slight left testicular discomfort after some sexual activityHe's had no chest pain, shortness of breath, coughing. 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 His physical activities and remains stable. He is quite active. He now has a cast present on the right arm dealing with a thumb injury.   Review of Systems     Objective:   Physical Exam Alert and in no distress. Homans sign negative. No palpable tenderness to the medial thigh or inguinal area. Penis and testes normal.       Assessment & Plan:  Pain of left lower extremity I reassured him that I did not find any evidence of DVT or other pathology. Commend conservative care. He was comfortable with this.

## 2015-01-12 ENCOUNTER — Ambulatory Visit (INDEPENDENT_AMBULATORY_CARE_PROVIDER_SITE_OTHER): Payer: BLUE CROSS/BLUE SHIELD | Admitting: Family Medicine

## 2015-01-12 ENCOUNTER — Encounter: Payer: Self-pay | Admitting: Family Medicine

## 2015-01-12 VITALS — BP 118/68 | HR 72 | Ht 70.0 in | Wt 174.4 lb

## 2015-01-12 DIAGNOSIS — M1990 Unspecified osteoarthritis, unspecified site: Secondary | ICD-10-CM | POA: Diagnosis not present

## 2015-01-12 DIAGNOSIS — J301 Allergic rhinitis due to pollen: Secondary | ICD-10-CM | POA: Diagnosis not present

## 2015-01-12 DIAGNOSIS — M1610 Unilateral primary osteoarthritis, unspecified hip: Secondary | ICD-10-CM

## 2015-01-12 DIAGNOSIS — R7989 Other specified abnormal findings of blood chemistry: Secondary | ICD-10-CM

## 2015-01-12 DIAGNOSIS — E291 Testicular hypofunction: Secondary | ICD-10-CM | POA: Diagnosis not present

## 2015-01-12 DIAGNOSIS — Z0000 Encounter for general adult medical examination without abnormal findings: Secondary | ICD-10-CM

## 2015-01-12 HISTORY — DX: Allergic rhinitis due to pollen: J30.1

## 2015-01-12 LAB — CBC WITH DIFFERENTIAL/PLATELET
Basophils Absolute: 0 10*3/uL (ref 0.0–0.1)
Basophils Relative: 0 % (ref 0–1)
EOS PCT: 1 % (ref 0–5)
Eosinophils Absolute: 0 10*3/uL (ref 0.0–0.7)
HCT: 43.7 % (ref 39.0–52.0)
HEMOGLOBIN: 15 g/dL (ref 13.0–17.0)
LYMPHS ABS: 1.7 10*3/uL (ref 0.7–4.0)
LYMPHS PCT: 35 % (ref 12–46)
MCH: 30.1 pg (ref 26.0–34.0)
MCHC: 34.3 g/dL (ref 30.0–36.0)
MCV: 87.8 fL (ref 78.0–100.0)
MPV: 10.2 fL (ref 8.6–12.4)
Monocytes Absolute: 0.4 10*3/uL (ref 0.1–1.0)
Monocytes Relative: 8 % (ref 3–12)
Neutro Abs: 2.7 10*3/uL (ref 1.7–7.7)
Neutrophils Relative %: 56 % (ref 43–77)
Platelets: 217 10*3/uL (ref 150–400)
RBC: 4.98 MIL/uL (ref 4.22–5.81)
RDW: 14.4 % (ref 11.5–15.5)
WBC: 4.9 10*3/uL (ref 4.0–10.5)

## 2015-01-12 LAB — POCT URINALYSIS DIPSTICK
BILIRUBIN UA: NEGATIVE
Glucose, UA: NEGATIVE
KETONES UA: NEGATIVE
Leukocytes, UA: NEGATIVE
NITRITE UA: NEGATIVE
PH UA: 6
Protein, UA: NEGATIVE
Spec Grav, UA: >=1.03
Urobilinogen, UA: NEGATIVE

## 2015-01-12 LAB — LIPID PANEL
CHOLESTEROL: 152 mg/dL (ref 0–200)
HDL: 42 mg/dL (ref >=40)
LDL Cholesterol: 97 mg/dL (ref 0–99)
TRIGLYCERIDES: 67 mg/dL (ref <150)
Total CHOL/HDL Ratio: 3.6 Ratio
VLDL: 13 mg/dL (ref 0–40)

## 2015-01-12 LAB — COMPREHENSIVE METABOLIC PANEL
ALBUMIN: 4.5 g/dL (ref 3.5–5.2)
ALK PHOS: 58 U/L (ref 39–117)
ALT: 21 U/L (ref 0–53)
AST: 18 U/L (ref 0–37)
BUN: 14 mg/dL (ref 6–23)
CALCIUM: 9.3 mg/dL (ref 8.4–10.5)
CHLORIDE: 105 meq/L (ref 96–112)
CO2: 25 mEq/L (ref 19–32)
Creat: 1.14 mg/dL (ref 0.50–1.35)
Glucose, Bld: 88 mg/dL (ref 70–99)
POTASSIUM: 4.4 meq/L (ref 3.5–5.3)
SODIUM: 141 meq/L (ref 135–145)
TOTAL PROTEIN: 6.9 g/dL (ref 6.0–8.3)
Total Bilirubin: 0.5 mg/dL (ref 0.2–1.2)

## 2015-01-12 NOTE — Progress Notes (Signed)
Subjective:    Patient ID: Jonathan Archer, male    DOB: 28-Mar-1971, 44 y.o.   MRN: [id number]  HPI He is here for complete examination. He has no concerns or complaints. Review his record indicates he was diagnosed with hypogonadism in 2011. 2 separate testosterone levels were below 300. He elected not to use testosterone due to fears of DVT. He did have DVT post surgically. He states that his libido and erectile issues are really quite minimal. He has not used any ED medication. He states that his arthritis is tolerable. He does have allergies but only minimally. When he mows the lawn he will get sneezing and rhinorrhea however shortly after he finishes mowing, the symptoms go away. He has no other concerns or complaints. No indigestion, chest pain, shortness of breath. Work is going well. He is in a 6 year relationship which is going well. Family and social history were reviewed   Review of Systems  All other systems reviewed and are negative.      Objective:   Physical Exam BP 118/68 mmHg  Pulse 72  Ht 5\' 10"  (1.778 m)  Wt 174 lb 6.4 oz (79.107 kg)  BMI 25.02 kg/m2  General Appearance:    Alert, cooperative, no distress, appears stated age  Head:    Normocephalic, without obvious abnormality, atraumatic  Eyes:    PERRL, conjunctiva/corneas clear, EOM's intact, fundi    benign  Ears:    Normal TM's and external ear canals  Nose:   Nares normal, mucosa normal, no drainage or sinus   tenderness  Throat:   Lips, mucosa, and tongue normal; teeth and gums normal  Neck:   Supple, no lymphadenopathy;  thyroid:  no   enlargement/tenderness/nodules; no carotid   bruit or JVD  Back:    Spine nontender, no curvature, ROM normal, no CVA     tenderness  Lungs:     Clear to auscultation bilaterally without wheezes, rales or     ronchi; respirations unlabored  Chest Wall:    No tenderness or deformity   Heart:    Regular rate and rhythm, S1 and S2 normal, no murmur, rub   or gallop  Breast  Exam:    No chest wall tenderness, masses or gynecomastia  Abdomen:     Soft, non-tender, nondistended, normoactive bowel sounds,    no masses, no hepatosplenomegaly  Genitalia:    Normal male external genitalia without lesions.  Testicles without masses.  No inguinal hernias.  Rectal:    Deferred. Stool cards given with proper instructions.  Extremities:   No clubbing, cyanosis or edema  Pulses:   2+ and symmetric all extremities  Skin:   Skin color, texture, turgor normal, no rashes or lesions  Lymph nodes:   Cervical, supraclavicular, and axillary nodes normal  Neurologic:   CNII-XII intact, normal strength, sensation and gait; reflexes 2+ and symmetric throughout          Psych:   Normal mood, affect, hygiene and grooming.          Assessment & Plan:  Routine general medical examination at a health care facility - Plan: CBC with Differential/Platelet, Comprehensive metabolic panel, Lipid panel, POCT urinalysis dipstick  Low testosterone - Plan: Testosterone  Arthritis of hip  Allergic rhinitis due to pollen I explained that his allergies are causing very little difficulty and therefore no intervention is really necessary other than using an anti-histamine if he has to mow the lawn. I will follow-up on his  posture own.

## 2015-01-13 LAB — TESTOSTERONE: Testosterone: 447 ng/dL (ref 300–890)

## 2015-01-19 ENCOUNTER — Other Ambulatory Visit (INDEPENDENT_AMBULATORY_CARE_PROVIDER_SITE_OTHER): Payer: BLUE CROSS/BLUE SHIELD

## 2015-01-19 DIAGNOSIS — Z1211 Encounter for screening for malignant neoplasm of colon: Secondary | ICD-10-CM

## 2015-01-19 LAB — HEMOCCULT GUIAC POC 1CARD (OFFICE)
Card #3 Fecal Occult Blood, POC: NEGATIVE
FECAL OCCULT BLD: NEGATIVE
FECAL OCCULT BLD: NEGATIVE

## 2016-02-02 ENCOUNTER — Telehealth: Payer: Self-pay | Admitting: Family Medicine

## 2016-02-02 NOTE — Telephone Encounter (Signed)
Recvd a message frm answering service to call pt to schedule an appt with Dr Susann GivensLalonde. Called pt and left message to call our office

## 2016-02-03 ENCOUNTER — Encounter: Payer: Self-pay | Admitting: Family Medicine

## 2016-02-03 ENCOUNTER — Ambulatory Visit (INDEPENDENT_AMBULATORY_CARE_PROVIDER_SITE_OTHER): Payer: No Typology Code available for payment source | Admitting: Family Medicine

## 2016-02-03 VITALS — BP 120/82 | HR 55 | Wt 162.0 lb

## 2016-02-03 DIAGNOSIS — Z6379 Other stressful life events affecting family and household: Secondary | ICD-10-CM

## 2016-02-03 DIAGNOSIS — R634 Abnormal weight loss: Secondary | ICD-10-CM | POA: Diagnosis not present

## 2016-02-03 DIAGNOSIS — E739 Lactose intolerance, unspecified: Secondary | ICD-10-CM

## 2016-02-03 DIAGNOSIS — R292 Abnormal reflex: Secondary | ICD-10-CM | POA: Diagnosis not present

## 2016-02-03 LAB — CBC WITH DIFFERENTIAL/PLATELET
BASOS PCT: 0 %
Basophils Absolute: 0 cells/uL (ref 0–200)
EOS PCT: 1 %
Eosinophils Absolute: 71 cells/uL (ref 15–500)
HCT: 45.8 % (ref 38.5–50.0)
HEMOGLOBIN: 15.8 g/dL (ref 13.2–17.1)
LYMPHS ABS: 1846 {cells}/uL (ref 850–3900)
Lymphocytes Relative: 26 %
MCH: 30.2 pg (ref 27.0–33.0)
MCHC: 34.5 g/dL (ref 32.0–36.0)
MCV: 87.6 fL (ref 80.0–100.0)
MPV: 10.1 fL (ref 7.5–12.5)
Monocytes Absolute: 426 cells/uL (ref 200–950)
Monocytes Relative: 6 %
NEUTROS ABS: 4757 {cells}/uL (ref 1500–7800)
Neutrophils Relative %: 67 %
Platelets: 231 10*3/uL (ref 140–400)
RBC: 5.23 MIL/uL (ref 4.20–5.80)
RDW: 14.2 % (ref 11.0–15.0)
WBC: 7.1 10*3/uL (ref 4.0–10.5)

## 2016-02-03 LAB — TSH: TSH: 1.98 m[IU]/L (ref 0.40–4.50)

## 2016-02-03 NOTE — Progress Notes (Signed)
   Subjective:    Patient ID: Jonathan Archer, male    DOB: 11/24/1970, 45 y.o.   MRN: [id number]  HPI He complains of a two-month history of difficulty with nausea especially in the morning and weight loss.Marland Kitchen. He also complains of vague abdominal discomfort. He says that he can eat but then the food tastes bad. He also is noted difficulty with no canal products causing some diarrhea. He has had no black tarry stools. He did have an episode of this several years ago and did respond to conservative care with Prilosec and Zofran. He also notes a slight tremor in his left hand when he is writing. He has not noted tremor anywhere else, skin or hair changes. He also has been under a lot of stress dealing with one of his sons who is having legal difficulties.   Review of Systems     Objective:   Physical Exam Alert and in no distress. Tympanic membranes and canals are normal. Pharyngeal area is normal. Neck is supple without adenopathy or thyromegaly. Cardiac exam shows a regular sinus rhythm without murmurs or gallops. Lungs are clear to auscultation. No tremor noted. DTRs are 3+. Skin is normal. Abdominal exam shows no masses or tenderness with normal bowel sounds. Medical record shows a several pound weight loss since last visit.        Assessment & Plan:  Loss of weight - Plan: CBC with Differential/Platelet, Comprehensive metabolic panel, TSH  Lactose intolerance  Hyperreflexia - Plan: CBC with Differential/Platelet, Comprehensive metabolic panel, TSH  Stressful life event affecting family I will do blood work including TSH. He obviously will be avoiding milk and milk products. We discussed the stress that he is under and he seems to be handling this fairly well. Apparently his ex-wife is the major enabler in that particular situation.

## 2016-02-04 LAB — COMPREHENSIVE METABOLIC PANEL
ALBUMIN: 4.6 g/dL (ref 3.6–5.1)
ALT: 31 U/L (ref 9–46)
AST: 23 U/L (ref 10–40)
Alkaline Phosphatase: 60 U/L (ref 40–115)
BILIRUBIN TOTAL: 0.7 mg/dL (ref 0.2–1.2)
BUN: 20 mg/dL (ref 7–25)
CO2: 20 mmol/L (ref 20–31)
CREATININE: 1.49 mg/dL — AB (ref 0.60–1.35)
Calcium: 9.9 mg/dL (ref 8.6–10.3)
Chloride: 101 mmol/L (ref 98–110)
Glucose, Bld: 91 mg/dL (ref 65–99)
Potassium: 4.8 mmol/L (ref 3.5–5.3)
SODIUM: 137 mmol/L (ref 135–146)
TOTAL PROTEIN: 7.8 g/dL (ref 6.1–8.1)

## 2016-02-15 ENCOUNTER — Telehealth: Payer: Self-pay | Admitting: Family Medicine

## 2016-02-15 NOTE — Telephone Encounter (Signed)
If he is doing well on the Prilosec, let's continue on that for the time being and not worry about going to GI since he is getting better

## 2016-02-15 NOTE — Telephone Encounter (Signed)
Pt called to update Dr Susann Givens on his stomach. Prilosec is working & he is eating more. Not gaining any weight, this could be that he is drinking lots of water & working out in heat. Pt said Dr Susann Givens wanted him to see a GI doctor & he has not heard anything about that appt yet.

## 2016-05-30 ENCOUNTER — Encounter: Payer: Self-pay | Admitting: Family Medicine

## 2016-05-30 ENCOUNTER — Ambulatory Visit (INDEPENDENT_AMBULATORY_CARE_PROVIDER_SITE_OTHER): Payer: No Typology Code available for payment source | Admitting: Family Medicine

## 2016-05-30 VITALS — BP 110/80 | HR 61 | Temp 100.0°F | Wt 173.0 lb

## 2016-05-30 DIAGNOSIS — J029 Acute pharyngitis, unspecified: Secondary | ICD-10-CM

## 2016-05-30 MED ORDER — AMOXICILLIN 875 MG PO TABS: 875.0000 mg | ORAL_TABLET | Freq: Two times a day (BID) | ORAL | 0 refills | Status: DC

## 2016-05-30 NOTE — Progress Notes (Signed)
   Subjective:    Patient ID: Winnifred FriarDavid P Nembhard, male    DOB: 05/23/1971, 45 y.o.   MRN: [id number]  HPI He complains of a three-day history of started with sore throat followed by fever, chills, myalgias, earache. He does not smoke.   Review of Systems     Objective:   Physical Exam Alert and toxic appearing. Tympanic membranes and canals are normal. Pharyngeal area is normal. Neck is supple without adenopathy or thyromegaly but tenderness to palpation in the anterior cervical area. Cardiac exam shows a regular sinus rhythm without murmurs or gallops. Lungs are clear to auscultation. Strep screen is negative       Assessment & Plan:  Acute pharyngitis, unspecified etiology - Plan: amoxicillin (AMOXIL) 875 MG tablet I decided to go ahead and treat him as he does appear quite toxic. He was comfortable with this. He will call if further difficulty.

## 2016-06-09 ENCOUNTER — Encounter: Payer: Self-pay | Admitting: Family Medicine

## 2016-06-09 ENCOUNTER — Ambulatory Visit (INDEPENDENT_AMBULATORY_CARE_PROVIDER_SITE_OTHER): Payer: No Typology Code available for payment source | Admitting: Family Medicine

## 2016-06-09 VITALS — BP 100/70 | HR 60 | Temp 98.2°F | Wt 168.0 lb

## 2016-06-09 DIAGNOSIS — R062 Wheezing: Secondary | ICD-10-CM

## 2016-06-09 DIAGNOSIS — J209 Acute bronchitis, unspecified: Secondary | ICD-10-CM

## 2016-06-09 MED ORDER — ALBUTEROL SULFATE HFA 108 (90 BASE) MCG/ACT IN AERS: 2.0000 | INHALATION_SPRAY | Freq: Four times a day (QID) | RESPIRATORY_TRACT | 0 refills | Status: DC | PRN

## 2016-06-09 MED ORDER — LEVOFLOXACIN 500 MG PO TABS
500.0000 mg | ORAL_TABLET | Freq: Every day | ORAL | 0 refills | Status: DC
Start: 2016-06-09 — End: 2017-06-25

## 2016-06-09 NOTE — Progress Notes (Signed)
   Subjective:    Patient ID: Jonathan FriarDavid P Canelo, male    DOB: 11/04/1970, 45 y.o.   MRN: [id number]  HPI He is here for recheck. He does state that the sore throat is much better but he still having difficulty with nasal congestion and coughing and now wheezing as well as malaise and fatigue. No fever or chills, sore throat.   Review of Systems     Objective:   Physical Exam Alert and in no distress. Tympanic membranes and canals are normal. Pharyngeal area is normal. Neck is supple without adenopathy or thyromegaly. Cardiac exam shows a regular sinus rhythm without murmurs or gallops. Lungs are clear to auscultation.        Assessment & Plan:  Acute bronchitis, unspecified organism - Plan: levofloxacin (LEVAQUIN) 500 MG tablet  Wheezing - Plan: albuterol (PROVENTIL HFA;VENTOLIN HFA) 108 (90 Base) MCG/ACT inhaler He will keep me informed and if no improvement, x-rays and blood work will be done.

## 2017-02-04 ENCOUNTER — Emergency Department (HOSPITAL_BASED_OUTPATIENT_CLINIC_OR_DEPARTMENT_OTHER)
Admission: EM | Admit: 2017-02-04 | Discharge: 2017-02-04 | Disposition: A | Discharge status: Home / Self Care | Payer: Self-pay | Attending: Emergency Medicine | Admitting: Emergency Medicine

## 2017-02-04 ENCOUNTER — Encounter (HOSPITAL_BASED_OUTPATIENT_CLINIC_OR_DEPARTMENT_OTHER): Payer: Self-pay | Admitting: Emergency Medicine

## 2017-02-04 ENCOUNTER — Emergency Department (HOSPITAL_BASED_OUTPATIENT_CLINIC_OR_DEPARTMENT_OTHER): Payer: Self-pay

## 2017-02-04 DIAGNOSIS — Y929 Unspecified place or not applicable: Secondary | ICD-10-CM | POA: Insufficient documentation

## 2017-02-04 DIAGNOSIS — Y9366 Activity, soccer: Secondary | ICD-10-CM | POA: Insufficient documentation

## 2017-02-04 DIAGNOSIS — Y999 Unspecified external cause status: Secondary | ICD-10-CM | POA: Insufficient documentation

## 2017-02-04 DIAGNOSIS — S62515A Nondisplaced fracture of proximal phalanx of left thumb, initial encounter for closed fracture: Secondary | ICD-10-CM | POA: Insufficient documentation

## 2017-02-04 DIAGNOSIS — W0110XA Fall on same level from slipping, tripping and stumbling with subsequent striking against unspecified object, initial encounter: Secondary | ICD-10-CM | POA: Insufficient documentation

## 2017-02-04 DIAGNOSIS — S62235A Other nondisplaced fracture of base of first metacarpal bone, left hand, initial encounter for closed fracture: Secondary | ICD-10-CM

## 2017-02-04 MED ORDER — OXYCODONE-ACETAMINOPHEN 5-325 MG PO TABS
2.0000 | ORAL_TABLET | Freq: Once | ORAL | Status: AC
  Administered 2017-02-04: 2 via ORAL
  Filled 2017-02-04: qty 2

## 2017-02-04 MED ORDER — OXYCODONE-ACETAMINOPHEN 5-325 MG PO TABS: 1.0000 | ORAL_TABLET | Freq: Four times a day (QID) | ORAL | 0 refills | Status: DC | PRN

## 2017-02-04 NOTE — ED Provider Notes (Signed)
[facility] Provider Note   CSN: [id number] Arrival date & time: 02/04/17  2057  By signing my name below, I, [name], attest that this documentation has been prepared under the direction and in the presence of Geoffery Lyonselo, Damarco Keysor, MD. Electronically Signed: [name], ED Scribe. 02/04/17. 10:04 PM.  History   Chief Complaint Chief Complaint  Patient presents with  . Hand Injury   The history is provided by the patient. No language interpreter was used.  Hand Injury   The incident occurred 1 to 2 hours ago. [place] the [place]. The injury mechanism was a fall. The pain is present in the left hand. The pain is moderate. The pain has been constant since the incident. He reports no foreign bodies present. The symptoms are aggravated by movement. He has tried nothing for the symptoms.   HPI Comments: Jonathan Archer is a 46 y.o. male who presents to the Emergency Department complaining of left thumb pain onset 1 hour ago. Pt fell during a soccer game on his left hand. Pt states pain is exacerbated by movement. No treatments tried PTA. Pt denies numbness or any other complaints at this time.  Past Medical History:  Diagnosis Date  . ED (erectile dysfunction)   . Hypogonadism male     Patient Active Problem List   Diagnosis Date Noted  . Allergic rhinitis due to pollen 01/12/2015  . Arthritis of hip 09/14/2011    Past Surgical History:  Procedure Laterality Date  . MENISECTOMY  2006       Home Medications    Prior to Admission medications   Medication Sig Start Date End Date Taking? Authorizing Provider  albuterol (PROVENTIL HFA;VENTOLIN HFA) 108 (90 Base) MCG/ACT inhaler Inhale 2 puffs into the lungs every 6 (six) hours as needed for wheezing or shortness of breath. 06/09/16   [name], [name], MD  amoxicillin (AMOXIL) 875 MG tablet Take 1 tablet (875 mg total) by mouth 2 (two) times daily. 05/30/16   [name], [name], MD  levofloxacin  (LEVAQUIN) 500 MG tablet Take 1 tablet (500 mg total) by mouth daily. 06/09/16   [name], [name], MD  oxyCODONE-acetaminophen (PERCOCET/ROXICET) 5-325 MG tablet Take 1-2 tablets by mouth every 6 (six) hours as needed for severe pain. 02/04/17   Geoffery Lyonselo, Enriqueta Augusta, MD  VIAGRA 100 MG tablet TAKE ONE TABLET BY MOUTH AS NEEDED FOR  ERECTILE  DYSFUNCTION 09/06/12   [name], [name], MD    Family History No family history on file.  Social History Social History  Substance Use Topics  . Smoking status: Never Smoker  . Smokeless tobacco: Never Used  . Alcohol use Not on file     Allergies   Vicodin [hydrocodone-acetaminophen]   Review of Systems Review of Systems  Musculoskeletal: Positive for arthralgias and myalgias.  Neurological: Negative for numbness.     Physical Exam Updated Vital Signs BP (!) 135/99 (BP Location: Left Arm)   Pulse 66   Temp 98.7 F (37.1 C) (Oral)   Resp 20   SpO2 100%   Physical Exam  Constitutional: He is oriented to person, place, and time. He appears well-developed and well-nourished.  HENT:  Head: Normocephalic.  Eyes: EOM are normal.  Neck: Normal range of motion.  Cardiovascular: Normal rate.   Pulmonary/Chest: Effort normal.  Abdominal: He exhibits no distension.  Musculoskeletal: Normal range of motion. He exhibits edema and deformity.  There is swelling an deformity to the base of the 5th metacarpal. Distal  cap refill, motor and sensation intact.   Neurological: He is alert and oriented to person, place, and time.  Psychiatric: He has a normal mood and affect.  Nursing note and vitals reviewed.    ED Treatments / Results  DIAGNOSTIC STUDIES: Oxygen Saturation is 100% on RA, normal by my interpretation.   COORDINATION OF CARE: 9:25 PM-Discussed next steps with pt including follow up with a hand specialist. Pt verbalized understanding and is agreeable with the plan.   Labs (all labs ordered are listed, but only abnormal results are  displayed) Labs Reviewed - No data to display  EKG  EKG Interpretation None       Radiology Dg Hand Complete Left  Result Date: 02/04/2017 CLINICAL DATA:  Larey Seat on outstretched hand while playing soccer this evening. Left hand pain and swelling. Left thumb deformity. EXAM: LEFT HAND - COMPLETE 3+ VIEW COMPARISON:  None. FINDINGS: There is an acute, impacted appearing and slightly comminuted fracture at the base of the left first metacarpal without intra-articular extension of the fracture. Carpal bones and distal radius as well as ulna appear intact. No joint dislocations. IMPRESSION: Acute, closed, impacted appearing and slightly comminuted fracture at the base of the left first metacarpal. Electronically Signed   By: Tollie Eth M.D.   On: 02/04/2017 21:22    Procedures Procedures (including critical care time)  Medications Ordered in ED Medications  oxyCODONE-acetaminophen (PERCOCET/ROXICET) 5-325 MG per tablet 2 tablet (2 tablets Oral Given 02/04/17 2136)     Initial Impression / Assessment and Plan / ED Course  I have reviewed the triage vital signs and the nursing notes.  Pertinent labs & imaging results that were available during my care of the patient were reviewed by me and considered in my medical decision making (see chart for details).  X-rays show a fracture at the base of the right first metacarpal. This was placed in a thumb spica splint and the patient will follow-up with hand surgery tomorrow. He has seen Dr. Amanda Pea in the past and is requesting to see him again. I've advised to call in the morning to make these arrangements.  Final Clinical Impressions(s) / ED Diagnoses   Final diagnoses:  Other closed nondisplaced fracture of base of first metacarpal bone of left hand, initial encounter    New Prescriptions Discharge Medication List as of 02/04/2017  9:32 PM    START taking these medications   Details  oxyCODONE-acetaminophen (PERCOCET/ROXICET) 5-325 MG  tablet Take 1-2 tablets by mouth every 6 (six) hours as needed for severe pain., Starting Sun 02/04/2017, Print       I personally performed the services described in this documentation, which was scribed in my presence. The recorded information has been reviewed and is accurate.        Geoffery Lyons, MD 02/04/17 2218

## 2017-02-04 NOTE — Discharge Instructions (Signed)
Wear splint as applied until followed up by orthopedics.  Percocet as prescribed as needed for pain.  Ice for 20 minutes every 2 hours while awake for the next 2 days.  Follow-up with Dr. Amanda PeaGramig from hand surgery in the next 2-3 days. Call tomorrow to make this appointment.

## 2017-02-04 NOTE — ED Triage Notes (Signed)
Pt presents with c/o left hand thumb pain after falling during a soccer game.

## 2017-06-23 HISTORY — PX: ARTHROSCOPIC REPAIR ACL: SUR80

## 2017-06-25 ENCOUNTER — Ambulatory Visit
Admission: RE | Admit: 2017-06-25 | Discharge: 2017-06-25 | Disposition: A | Discharge status: Home / Self Care | Payer: 59 | Source: Ambulatory Visit | Attending: Family Medicine | Admitting: Family Medicine

## 2017-06-25 ENCOUNTER — Ambulatory Visit (INDEPENDENT_AMBULATORY_CARE_PROVIDER_SITE_OTHER): Payer: 59 | Admitting: Family Medicine

## 2017-06-25 VITALS — BP 140/80 | HR 76 | Resp 16 | Wt 189.2 lb

## 2017-06-25 DIAGNOSIS — M25462 Effusion, left knee: Secondary | ICD-10-CM

## 2017-06-25 DIAGNOSIS — S8992XA Unspecified injury of left lower leg, initial encounter: Secondary | ICD-10-CM

## 2017-06-25 DIAGNOSIS — M25562 Pain in left knee: Secondary | ICD-10-CM | POA: Diagnosis not present

## 2017-06-25 MED ORDER — HYDROCODONE-ACETAMINOPHEN 5-325 MG PO TABS: 1.0000 | ORAL_TABLET | Freq: Four times a day (QID) | ORAL | 0 refills | Status: DC | PRN

## 2017-06-25 NOTE — Patient Instructions (Signed)
Ice for 20 minutes 3 times per day.  800 mg of ibuprofen 3 times per day and I will call in a pain medication .

## 2017-06-25 NOTE — Progress Notes (Signed)
   Subjective:    Patient ID: Jonathan FriarDavid P Oser, male    DOB: 12/02/1970, 46 y.o.   MRN: [id number]  HPI Thanks he sustained an injury to his left knee Saturday while playing recreational soccer.  He describes an injury with his knee fully extended and hearing 2 pops.  He was seen initially by Dr. Charlann Boxerlin who was on the field.  He thought it was an MCL.  He has been in a brace and having a lot of pain with swelling.  Review of Systems     Objective:   Physical Exam Alert and complaining of left knee pain.  An effusion is present making it difficult to do a complete exam.  He does have limitation of range of full extension and flexion.  Could not fully assess ACL or medial/lateral collateral ligaments.       Assessment & Plan:  Acute pain of left knee - Plan: HYDROcodone-acetaminophen (NORCO) 5-325 MG tablet, DG Knee Complete 4 Views Left  Effusion of left knee - Plan: DG Knee Complete 4 Views Left  Injury of left knee, initial encounter I discussed the options with him.  At this point we will get an x-ray to ensure no bony abnormalities.  Plan to recheck in 3 weeks.  Do not feel that the joint needs to be tapped at the present time but will reserve it for later if the becomes more uncomfortable.

## 2017-06-29 ENCOUNTER — Telehealth: Payer: Self-pay | Admitting: Family Medicine

## 2017-06-29 NOTE — Telephone Encounter (Signed)
He states that he woke up in more pain than he expected.  He has been using codeine mainly at night but no medication during the day.  He states that he thinks the swelling has gone down a little bit.  I recommended 800 mg 3 times daily of ibuprofen and use the codeine on an as-needed basis.  Again told him to avoid driving which she has been if he is using the codeine.

## 2017-06-29 NOTE — Telephone Encounter (Signed)
Pt called and is wanting to talk to you about his leg, it is hurting and wants to know if he needs to come in, he states he needs just wants to talks to you first, he can be reached at [phone],

## 2017-07-13 ENCOUNTER — Encounter: Payer: Self-pay | Admitting: Family Medicine

## 2017-07-13 ENCOUNTER — Ambulatory Visit (INDEPENDENT_AMBULATORY_CARE_PROVIDER_SITE_OTHER): Payer: 59 | Admitting: Family Medicine

## 2017-07-13 VITALS — BP 116/60 | HR 55 | Resp 18 | Wt 187.4 lb

## 2017-07-13 DIAGNOSIS — S83412D Sprain of medial collateral ligament of left knee, subsequent encounter: Secondary | ICD-10-CM

## 2017-07-13 DIAGNOSIS — M1610 Unilateral primary osteoarthritis, unspecified hip: Secondary | ICD-10-CM

## 2017-07-13 NOTE — Progress Notes (Signed)
   Subjective:    Patient ID: Jonathan Archer, male    DOB: 12/11/1970, 46 y.o.   MRN: [id number]  HPI He is here for a recheck.  He states that he is still having some stiffness and slight tenderness.  He has been involved in physical therapy.  His initial examination showed a significant effusion.  He was examined on the field when he injured his knee by an orthopedic surgeon and apparently there was some evidence of MCL damage.   Review of Systems     Objective:   Physical Exam Alert and in no distress.  Minimal effusion is still present.  Medial collateral ligament intact.  Lateral collateral ligament also okay.  Anterior drawer was negative.  McMurray's testing causes no pain.       Assessment & Plan:  Sprain of medial collateral ligament of left knee, subsequent encounter  Arthritis of hip He will continue with physical therapy for the next several months.  Discussed continuing to play soccer and I said that that was be his decision but I had no problem with him playing again as long as the knee is totally back to normal.  If not and he will return here for reevaluation, possible MRI and referral.  He is comfortable with that. At the end of the encounter I discussed arthritis and treatment.  He does have some hip arthritis.  Discussed the use of Tylenol followed by NSAID and then possible injections.

## 2017-09-06 ENCOUNTER — Encounter: Payer: Self-pay | Admitting: Family Medicine

## 2017-09-06 ENCOUNTER — Ambulatory Visit (INDEPENDENT_AMBULATORY_CARE_PROVIDER_SITE_OTHER): Payer: 59 | Admitting: Family Medicine

## 2017-09-06 VITALS — BP 128/90 | HR 78 | Wt 188.8 lb

## 2017-09-06 DIAGNOSIS — M2392 Unspecified internal derangement of left knee: Secondary | ICD-10-CM | POA: Diagnosis not present

## 2017-09-06 NOTE — Progress Notes (Signed)
   Subjective:    Patient ID: Jonathan Archer, male    DOB: 05/23/1971, 47 y.o.   MRN: [id number]  HPI He is here for recheck on left knee discomfort.  He was seen December 3 and in exam was difficult to do due to an effusion.  He was seen again on the 21st and at that time his exam was essentially negative.  X-rays were negative she since then he has been doing the  and continues to have knee pain.  He has had some difficulty with anterior knee popping but no locking or grinding.  His pain now is mainly posterior pain with radiation down his leg.  Review of Systems     Objective:   Physical Exam  alert and in no distress.  No effusion noted.  Tender to the medial joint line with positive McMurray's testing.  Anterior drawer is also positive.  Medial and lateral collateral ligaments intact.       Assessment & Plan:  Internal derangement of knee joint, left - Plan: MR Knee Left  Wo Contrast Exam shows possible meniscal damage as well as anterior cruciate ligament injury.

## 2017-09-12 ENCOUNTER — Ambulatory Visit
Admission: RE | Admit: 2017-09-12 | Discharge: 2017-09-12 | Disposition: A | Discharge status: Home / Self Care | Payer: 59 | Source: Ambulatory Visit | Attending: Family Medicine | Admitting: Family Medicine

## 2017-09-12 DIAGNOSIS — M2392 Unspecified internal derangement of left knee: Secondary | ICD-10-CM

## 2017-09-14 ENCOUNTER — Other Ambulatory Visit: Payer: Self-pay | Admitting: Family Medicine

## 2017-09-14 DIAGNOSIS — S83512A Sprain of anterior cruciate ligament of left knee, initial encounter: Secondary | ICD-10-CM

## 2017-10-17 DIAGNOSIS — M25562 Pain in left knee: Secondary | ICD-10-CM

## 2017-10-17 HISTORY — DX: Pain in left knee: M25.562

## 2018-01-07 ENCOUNTER — Ambulatory Visit
Admission: RE | Admit: 2018-01-07 | Discharge: 2018-01-07 | Disposition: A | Discharge status: Home / Self Care | Payer: 59 | Source: Ambulatory Visit | Attending: Family Medicine | Admitting: Family Medicine

## 2018-01-07 ENCOUNTER — Ambulatory Visit (INDEPENDENT_AMBULATORY_CARE_PROVIDER_SITE_OTHER): Payer: 59 | Admitting: Family Medicine

## 2018-01-07 ENCOUNTER — Encounter: Payer: Self-pay | Admitting: Family Medicine

## 2018-01-07 VITALS — BP 132/80 | HR 57 | Temp 98.2°F | Wt 196.8 lb

## 2018-01-07 DIAGNOSIS — Z8249 Family history of ischemic heart disease and other diseases of the circulatory system: Secondary | ICD-10-CM

## 2018-01-07 DIAGNOSIS — R0609 Other forms of dyspnea: Secondary | ICD-10-CM | POA: Diagnosis not present

## 2018-01-07 DIAGNOSIS — R05 Cough: Secondary | ICD-10-CM | POA: Diagnosis not present

## 2018-01-07 DIAGNOSIS — R059 Cough, unspecified: Secondary | ICD-10-CM

## 2018-01-07 NOTE — Progress Notes (Signed)
   Subjective:    Patient ID: Jonathan FriarDavid P Coppedge, male    DOB: 11/10/1970, 47 y.o.   MRN: [id number]  HPI He complains of a 2-day history of dry cough, slight chills, weakness shortness of breath and some dyspnea on exertion.  He states that he can walk up a flight of steps and have to up at the top to get his breath back and it can take at least a minute.  He has noted no heart rate changes, chest pain or irregular heart rate.  He now states that his sister had an MI at age 47.  She has no 45.  He states that he did not think to mention this to me in the past.  He does not smoke.   Review of Systems     Objective:   Physical Exam Alert and in no distress. Tympanic membranes and canals are normal. Pharyngeal area is normal. Neck is supple without adenopathy or thyromegaly. Cardiac exam shows a regular sinus rhythm without murmurs or gallops. Lungs are clear to auscultation. EKG does show a incomplete right bundle.  No other findings noted.       Assessment & Plan:  Family history of heart disease in male family member before age 465 - Plan: CBC with Differential/Platelet, Comprehensive metabolic panel, Lipid panel, EKG 12-Lead, DG Chest 2 View  DOE (dyspnea on exertion) - Plan: CBC with Differential/Platelet, Comprehensive metabolic panel, Lipid panel, Brain natriuretic peptide, EKG 12-Lead, DG Chest 2 View  Cough - Plan: CBC with Differential/Platelet, Comprehensive metabolic panel, DG Chest 2 View EKG did come back normal.  I will await the blood studies and possibly refer for further cardiac work-up.

## 2018-01-08 ENCOUNTER — Other Ambulatory Visit: Payer: Self-pay

## 2018-01-08 DIAGNOSIS — R062 Wheezing: Secondary | ICD-10-CM

## 2018-01-08 LAB — CBC WITH DIFFERENTIAL/PLATELET
BASOS: 0 %
Basophils Absolute: 0 10*3/uL (ref 0.0–0.2)
EOS (ABSOLUTE): 0.1 10*3/uL (ref 0.0–0.4)
Eos: 2 %
Hematocrit: 44.1 % (ref 37.5–51.0)
Hemoglobin: 15.1 g/dL (ref 13.0–17.7)
Immature Grans (Abs): 0 10*3/uL (ref 0.0–0.1)
Immature Granulocytes: 0 %
Lymphocytes Absolute: 1.1 10*3/uL (ref 0.7–3.1)
Lymphs: 20 %
MCH: 30.2 pg (ref 26.6–33.0)
MCHC: 34.2 g/dL (ref 31.5–35.7)
MCV: 88 fL (ref 79–97)
MONOS ABS: 0.6 10*3/uL (ref 0.1–0.9)
Monocytes: 10 %
NEUTROS ABS: 3.8 10*3/uL (ref 1.4–7.0)
NEUTROS PCT: 68 %
PLATELETS: 185 10*3/uL (ref 150–450)
RBC: 5 x10E6/uL (ref 4.14–5.80)
RDW: 14.6 % (ref 12.3–15.4)
WBC: 5.5 10*3/uL (ref 3.4–10.8)

## 2018-01-08 LAB — COMPREHENSIVE METABOLIC PANEL
A/G RATIO: 1.7 (ref 1.2–2.2)
ALT: 29 IU/L (ref 0–44)
AST: 29 IU/L (ref 0–40)
Albumin: 4.7 g/dL (ref 3.5–5.5)
Alkaline Phosphatase: 87 IU/L (ref 39–117)
BILIRUBIN TOTAL: 0.3 mg/dL (ref 0.0–1.2)
BUN/Creatinine Ratio: 9 (ref 9–20)
BUN: 11 mg/dL (ref 6–24)
CHLORIDE: 98 mmol/L (ref 96–106)
CO2: 26 mmol/L (ref 20–29)
Calcium: 9.4 mg/dL (ref 8.7–10.2)
Creatinine, Ser: 1.25 mg/dL (ref 0.76–1.27)
GFR calc Af Amer: 79 mL/min/{1.73_m2} (ref >59)
GFR calc non Af Amer: 69 mL/min/{1.73_m2} (ref >59)
Globulin, Total: 2.7 g/dL (ref 1.5–4.5)
Glucose: 103 mg/dL — ABNORMAL HIGH (ref 65–99)
POTASSIUM: 4 mmol/L (ref 3.5–5.2)
Sodium: 140 mmol/L (ref 134–144)
Total Protein: 7.4 g/dL (ref 6.0–8.5)

## 2018-01-08 LAB — LIPID PANEL
Chol/HDL Ratio: 4.1 ratio (ref 0.0–5.0)
Cholesterol, Total: 196 mg/dL (ref 100–199)
HDL: 48 mg/dL (ref >39)
LDL Calculated: 123 mg/dL — ABNORMAL HIGH (ref 0–99)
TRIGLYCERIDES: 123 mg/dL (ref 0–149)
VLDL Cholesterol Cal: 25 mg/dL (ref 5–40)

## 2018-01-08 LAB — BRAIN NATRIURETIC PEPTIDE: BNP: 91.1 pg/mL (ref 0.0–100.0)

## 2018-01-08 MED ORDER — ALBUTEROL SULFATE HFA 108 (90 BASE) MCG/ACT IN AERS: 2.0000 | INHALATION_SPRAY | Freq: Four times a day (QID) | RESPIRATORY_TRACT | 0 refills | Status: DC | PRN

## 2018-01-09 ENCOUNTER — Telehealth: Payer: Self-pay

## 2018-01-09 NOTE — Telephone Encounter (Signed)
Pt called to inform us that after using albuterol inhaler last night he was still wheezing a little when he woke up to go to the restroom(around 3am). He used it again when he woke up for the day and he did not have any wheezing.   This is an update

## 2018-01-10 NOTE — Telephone Encounter (Signed)
Schedule him to be seen tomorrow and go ahead and do a PFT on him.

## 2018-01-11 ENCOUNTER — Encounter: Payer: Self-pay | Admitting: Family Medicine

## 2018-01-11 ENCOUNTER — Ambulatory Visit (INDEPENDENT_AMBULATORY_CARE_PROVIDER_SITE_OTHER): Payer: 59 | Admitting: Family Medicine

## 2018-01-11 VITALS — BP 124/86 | HR 83 | Temp 97.6°F | Wt 186.8 lb

## 2018-01-11 DIAGNOSIS — R0602 Shortness of breath: Secondary | ICD-10-CM

## 2018-01-11 DIAGNOSIS — J209 Acute bronchitis, unspecified: Secondary | ICD-10-CM

## 2018-01-11 DIAGNOSIS — R0609 Other forms of dyspnea: Secondary | ICD-10-CM

## 2018-01-11 DIAGNOSIS — Z8249 Family history of ischemic heart disease and other diseases of the circulatory system: Secondary | ICD-10-CM | POA: Diagnosis not present

## 2018-01-11 MED ORDER — AMOXICILLIN-POT CLAVULANATE 875-125 MG PO TABS: 1.0000 | ORAL_TABLET | Freq: Two times a day (BID) | ORAL | 0 refills | Status: DC

## 2018-01-11 NOTE — Progress Notes (Signed)
   Subjective:    Patient ID: Jonathan Archer, male    DOB: 10/18/1970, 47 y.o.   MRN: [id number]  HPI He is here for follow-up on difficulty with cough and congestion.  He now states that since last Sunday he developed nasal congestion, rhinorrhea, cough with slight chills as well as the shortness of breath and dyspnea on exertion.  He also complains of fatigue.  No sore throat, earache, chest congestion, fever or chills.  He did have a recent work-up more for cardiac problems as his initial evaluation pointed more towards that.  That work-up was negative.  He was given albuterol which he states did help with his cough and congestion.   Review of Systems     Objective:   Physical Exam Alert and in no distress. Tympanic membranes and canals are normal. Pharyngeal area is normal. Neck is supple without adenopathy or thyromegaly. Cardiac exam shows a regular sinus rhythm without murmurs or gallops. Lungs are clear to auscultation. PFT shows severe airway obstruction.       Assessment & Plan:  SOB (shortness of breath) - Plan: Spirometry with graph  DOE (dyspnea on exertion)  Family history of heart disease in male family member before age 47  Acute bronchitis, unspecified organism - Plan: amoxicillin-clavulanate (AUGMENTIN) 875-125 MG tablet  I discussed the symptoms with him and think it is reasonable to try him on Augmentin to see if it will help with his pulmonary symptoms.  He will keep me informed.  He does have surgery scheduled in several months and I think is reasonable to refer him to cardiology prior to the surgery especially with his family history.

## 2018-01-16 ENCOUNTER — Ambulatory Visit
Admission: RE | Admit: 2018-01-16 | Discharge: 2018-01-16 | Disposition: A | Discharge status: Home / Self Care | Payer: 59 | Source: Ambulatory Visit | Attending: Family Medicine | Admitting: Family Medicine

## 2018-01-16 ENCOUNTER — Encounter: Payer: Self-pay | Admitting: Family Medicine

## 2018-01-16 ENCOUNTER — Ambulatory Visit (INDEPENDENT_AMBULATORY_CARE_PROVIDER_SITE_OTHER): Payer: 59 | Admitting: Family Medicine

## 2018-01-16 VITALS — BP 129/92 | HR 83 | Temp 97.4°F | Wt 183.2 lb

## 2018-01-16 DIAGNOSIS — M940 Chondrocostal junction syndrome [Tietze]: Secondary | ICD-10-CM | POA: Diagnosis not present

## 2018-01-16 DIAGNOSIS — J209 Acute bronchitis, unspecified: Secondary | ICD-10-CM | POA: Diagnosis not present

## 2018-01-16 DIAGNOSIS — R0602 Shortness of breath: Secondary | ICD-10-CM | POA: Diagnosis not present

## 2018-01-16 MED ORDER — PREDNISONE 10 MG (48) PO TBPK: ORAL_TABLET | ORAL | 0 refills | Status: DC

## 2018-01-16 NOTE — Progress Notes (Signed)
   Subjective:    Patient ID: Jonathan Archer, male    DOB: 02/21/1971, 47 y.o.   MRN: [id number]  HPI He is here for a recheck.  He was doing well until last night when he had the onset again of right-sided chest pain.  He was seen on June 17 with chest pain as well as cough chills weakness.  X-ray at that time was negative.  Blood work also was negative.  He was seen again on the 21st pulmonary function studies did show severe airway obstruction.  I placed him on an inhaler and also gave him Augmentin.  He does state that he has some springtime allergies when he mows the grass but rarely takes any for it.  He is again complaining of right-sided chest pain as well as some shortness of breath.   Review of Systems     Objective:   Physical Exam Alert and slightly pale.  Tender to palpation along the right costochondral junction of the third fourth and fifth ribs.  Lungs are clear to auscultation.  Cardiac exam shows regular rhythm without murmurs or gallops.       Assessment & Plan:  SOB (shortness of breath) - Plan: DG Chest 2 View  Acute bronchitis, unspecified organism - Plan: DG Chest 2 View The x-ray is negative.  I will call in the prednisone.  If he continues have difficulty, I will refer to pulmonology.  He is to call me back in roughly 2 weeks and let me know how he is doing.  He really has no previous history of asthma this is a little disconcerting.

## 2018-03-22 ENCOUNTER — Telehealth: Payer: Self-pay | Admitting: Family Medicine

## 2018-03-22 NOTE — Telephone Encounter (Signed)
Pt having extreme needling pain from his ACL tear because he has not had surgery yet. The pain radiates from the top of his left knee where the Where the ACL tear is to to bottom of his left foot. He was not able to sleep but 30 minutes due to the pain. This has been going on for days now and getting worse and worse. Pt wants to know if he should come in to be seen today to get help with the pain or what do we suggest that he do? Pt has to be in court with his son at 2:00pm today so would like to come in before that if possible if an appointment is needed since not sure how long court will take.

## 2018-03-22 NOTE — Telephone Encounter (Signed)
Called pt and left message with Shane's instruction °

## 2018-03-22 NOTE — Telephone Encounter (Signed)
It appears that he is seeing orthopedist.  Have him call them today for this.

## 2018-04-12 ENCOUNTER — Encounter: Payer: Self-pay | Admitting: Family Medicine

## 2018-04-12 ENCOUNTER — Ambulatory Visit (INDEPENDENT_AMBULATORY_CARE_PROVIDER_SITE_OTHER): Payer: 59 | Admitting: Family Medicine

## 2018-04-12 VITALS — BP 126/86 | HR 51 | Temp 97.6°F | Wt 196.8 lb

## 2018-04-12 DIAGNOSIS — Z8249 Family history of ischemic heart disease and other diseases of the circulatory system: Secondary | ICD-10-CM

## 2018-04-12 DIAGNOSIS — Z23 Encounter for immunization: Secondary | ICD-10-CM | POA: Diagnosis not present

## 2018-04-12 DIAGNOSIS — Z01818 Encounter for other preprocedural examination: Secondary | ICD-10-CM

## 2018-04-12 DIAGNOSIS — I4510 Unspecified right bundle-branch block: Secondary | ICD-10-CM

## 2018-04-12 HISTORY — DX: Family history of ischemic heart disease and other diseases of the circulatory system: Z82.49

## 2018-04-12 HISTORY — DX: Unspecified right bundle-branch block: I45.10

## 2018-04-12 NOTE — Progress Notes (Signed)
   Subjective:    Patient ID: Jonathan Archer, male    DOB: 02/10/1971, 47 y.o.   MRN: [id number]  HPI He is here for preoperative evaluation.  He plans to have ACL repair.  Past medical history shows no trouble with chest pain, shortness of breath, allergies or asthma.  Does have a family history of heart disease.  He has had a recent EKG which showed incomplete RBBB.   Review of Systems     Objective:   Physical Exam Alert and in no distress. Tympanic membranes and canals are normal. Pharyngeal area is normal. Neck is supple without adenopathy or thyromegaly. Cardiac exam shows a regular sinus rhythm without murmurs or gallops. Lungs are clear to auscultation.        Assessment & Plan:  Preoperative examination - Plan: Ambulatory referral to Cardiology  Need for influenza vaccination - Plan: Flu Vaccine QUAD 6+ mos PF IM (Fluarix Quad PF)  Family history of heart disease in male family member before age 47 - Plan: Ambulatory referral to Cardiology  Incomplete RBBB Since he has a significant history of heart disease in his family, I think cardiology evaluation before surgery is warranted.

## 2018-04-24 ENCOUNTER — Ambulatory Visit (INDEPENDENT_AMBULATORY_CARE_PROVIDER_SITE_OTHER): Payer: 59 | Admitting: Cardiology

## 2018-04-24 ENCOUNTER — Other Ambulatory Visit: Payer: Self-pay

## 2018-04-24 ENCOUNTER — Encounter: Payer: Self-pay | Admitting: Cardiology

## 2018-04-24 VITALS — BP 120/78 | HR 59 | Ht 70.0 in | Wt 197.0 lb

## 2018-04-24 DIAGNOSIS — Z8249 Family history of ischemic heart disease and other diseases of the circulatory system: Secondary | ICD-10-CM

## 2018-04-24 DIAGNOSIS — Z01810 Encounter for preprocedural cardiovascular examination: Secondary | ICD-10-CM

## 2018-04-24 DIAGNOSIS — E782 Mixed hyperlipidemia: Secondary | ICD-10-CM

## 2018-04-24 DIAGNOSIS — R7989 Other specified abnormal findings of blood chemistry: Secondary | ICD-10-CM

## 2018-04-24 HISTORY — DX: Encounter for preprocedural cardiovascular examination: Z01.810

## 2018-04-24 HISTORY — DX: Mixed hyperlipidemia: E78.2

## 2018-04-24 LAB — D-DIMER, QUANTITATIVE: D-DIMER: 1.37 mg/L FEU — ABNORMAL HIGH (ref 0.00–0.49)

## 2018-04-24 NOTE — Addendum Note (Signed)
Addended by: Craige Cotta on: 04/24/2018 03:55 PM   Modules accepted: Orders

## 2018-04-24 NOTE — Addendum Note (Signed)
Addended by: Carren Rang on: 04/24/2018 10:38 AM   Modules accepted: Orders

## 2018-04-24 NOTE — Progress Notes (Signed)
Cardiology Office Note:    Date:  04/24/2018   ID:  Jonathan Archer, DOB June 12, 1971, MRN [id number]  PCP:  Ronnald Nian, MD  Cardiologist:  Garwin Brothers, MD   Referring MD: Ronnald Nian, MD    ASSESSMENT:    1. Pre-operative cardiovascular examination   2. Family history of heart disease in male family member before age 47   3. Mixed dyslipidemia    PLAN:    In order of problems listed above:  1. Primary prevention stressed with the patient.  Importance of compliance with diet and medication stressed and he vocalized understanding.  He vocalized understanding.  His blood pressure is stable. 2. In view of risk factors for coronary artery disease we will do a Lexiscan sestamibi.  If this is negative he is not at high risk for coronary events during the aforementioned surgery.  Meticulous hemodynamic monitoring will further reduce the risk of coronary events. 3. We will also do a d-dimer just to rule out any issues such as pulmonary thromboembolism in view of the fact that he planes of shortness of breath on exertion. 4. He will be seen in follow-up appointment on a as needed basis only.   Medication Adjustments/Labs and Tests Ordered: Current medicines are reviewed at length with the patient today.  Concerns regarding medicines are outlined above.  No orders of the defined types were placed in this encounter.  No orders of the defined types were placed in this encounter.    History of Present Illness:    Jonathan Archer is a 47 y.o. male who is being seen today for the evaluation of chest pain and preop cardiovascular evaluation at the request of Ronnald Nian, MD.  Patient is a pleasant 47 year old male.  He has past medical history of mild dyslipidemia.  He mentions to me that he has strong family history of coronary artery disease.  He has been having chest pain at times.  This is not related to exertion.  He has injury to his left knee.  He mentions to me that in the  recent past when he is at work and he climbs a flight of stairs he does get shortness of breath.  This is been steady for the past several weeks.  No orthopnea or PND.  At the time of my evaluation, the patient is alert awake oriented and in no distress.  Past Medical History:  Diagnosis Date  . ED (erectile dysfunction)   . Hypogonadism male     Past Surgical History:  Procedure Laterality Date  . MENISECTOMY  2006    Current Medications: Current Meds  Medication Sig  . albuterol (PROVENTIL HFA;VENTOLIN HFA) 108 (90 Base) MCG/ACT inhaler Inhale 2 puffs into the lungs every 6 (six) hours as needed for wheezing or shortness of breath.  Marland Kitchen ibuprofen (ADVIL,MOTRIN) 200 MG tablet Take 200 mg by mouth every 6 (six) hours as needed.     Allergies:   Vicodin [hydrocodone-acetaminophen]   Social History   Socioeconomic History  . Marital status: Single    Spouse name: Not on file  . Number of children: Not on file  . Years of education: Not on file  . Highest education level: Not on file  Occupational History  . Not on file  Social Needs  . Financial resource strain: Not on file  . Food insecurity:    Worry: Not on file    Inability: Not on file  . Transportation needs:  Medical: Not on file    Non-medical: Not on file  Tobacco Use  . Smoking status: Never Smoker  . Smokeless tobacco: Never Used  Substance and Sexual Activity  . Alcohol use: Not on file  . Drug use: Not on file  . Sexual activity: Not on file  Lifestyle  . Physical activity:    Days per week: Not on file    Minutes per session: Not on file  . Stress: Not on file  Relationships  . Social connections:    Talks on phone: Not on file    Gets together: Not on file    Attends religious service: Not on file    Active member of club or organization: Not on file    Attends meetings of clubs or organizations: Not on file    Relationship status: Not on file  Other Topics Concern  . Not on file  Social  History Narrative  . Not on file     Family History: The patient's family history includes Heart disease in his father, mother, and sister.  ROS:   Please see the history of present illness.    All other systems reviewed and are negative.  EKGs/Labs/Other Studies Reviewed:    The following studies were reviewed today: EKG reveals sinus rhythm and nonspecific ST-T changes.   Recent Labs: 01/07/2018: ALT 29; BNP 91.1; BUN 11; Creatinine, Ser 1.25; Hemoglobin 15.1; Platelets 185; Potassium 4.0; Sodium 140  Recent Lipid Panel    Component Value Date/Time   CHOL 196 01/07/2018 1453   TRIG 123 01/07/2018 1453   HDL 48 01/07/2018 1453   CHOLHDL 4.1 01/07/2018 1453   CHOLHDL 3.6 01/12/2015 0001   VLDL 13 01/12/2015 0001   LDLCALC 123 (H) 01/07/2018 1453    Physical Exam:    VS:  BP 120/78 (BP Location: Right Arm, Patient Position: Sitting, Cuff Size: Normal)   Pulse (!) 59   Ht 5\' 10"  (1.778 m)   Wt 197 lb (89.4 kg)   SpO2 95%   BMI 28.27 kg/m     Wt Readings from Last 3 Encounters:  04/24/18 197 lb (89.4 kg)  04/12/18 196 lb 12.8 oz (89.3 kg)  01/16/18 183 lb 3.2 oz (83.1 kg)     GEN: Patient is in no acute distress HEENT: Normal NECK: No JVD; No carotid bruits LYMPHATICS: No lymphadenopathy CARDIAC: S1 S2 regular, 2/6 systolic murmur at the apex. RESPIRATORY:  Clear to auscultation without rales, wheezing or rhonchi  ABDOMEN: Soft, non-tender, non-distended MUSCULOSKELETAL:  No edema; No deformity  SKIN: Warm and dry NEUROLOGIC:  Alert and oriented x 3 PSYCHIATRIC:  Normal affect    Signed, Garwin Brothers, MD  04/24/2018 10:14 AM    [facility]

## 2018-04-24 NOTE — Patient Instructions (Addendum)
Medication Instructions:  Your physician recommends that you continue on your current medications as directed. Please refer to the Current Medication list given to you today.   Labwork: Your physician recommends that you return for lab work today: D-Dimer   Testing/Procedures: Your physician has requested that you have a lexiscan myoview. For further information please visit [email or web address] Please follow instruction sheet, as given.   Follow-Up: Your physician recommends that you schedule a follow-up appointment in: as needed:if symptoms worsen or fail to improve  Any Other Special Instructions Will Be Listed Below (If Applicable).     If you need a refill on your cardiac medications before your next appointment, please call your pharmacy.

## 2018-04-25 ENCOUNTER — Encounter (HOSPITAL_BASED_OUTPATIENT_CLINIC_OR_DEPARTMENT_OTHER): Payer: Self-pay

## 2018-04-25 ENCOUNTER — Ambulatory Visit (HOSPITAL_BASED_OUTPATIENT_CLINIC_OR_DEPARTMENT_OTHER)
Admission: RE | Admit: 2018-04-25 | Discharge: 2018-04-25 | Disposition: A | Discharge status: Home / Self Care | Payer: 59 | Source: Ambulatory Visit | Attending: Cardiology | Admitting: Cardiology

## 2018-04-25 ENCOUNTER — Telehealth: Payer: Self-pay

## 2018-04-25 ENCOUNTER — Other Ambulatory Visit: Payer: Self-pay

## 2018-04-25 DIAGNOSIS — R7989 Other specified abnormal findings of blood chemistry: Secondary | ICD-10-CM

## 2018-04-25 DIAGNOSIS — I2699 Other pulmonary embolism without acute cor pulmonale: Secondary | ICD-10-CM

## 2018-04-25 DIAGNOSIS — Z7901 Long term (current) use of anticoagulants: Secondary | ICD-10-CM

## 2018-04-25 MED ORDER — IOPAMIDOL (ISOVUE-370) INJECTION 76%
100.0000 mL | Freq: Once | INTRAVENOUS | Status: AC | PRN
  Administered 2018-04-25: 100 mL via INTRAVENOUS

## 2018-04-25 MED ORDER — APIXABAN 5 MG PO TABS: 5.0000 mg | ORAL_TABLET | Freq: Two times a day (BID) | ORAL | 2 refills | Status: DC

## 2018-04-25 NOTE — Telephone Encounter (Signed)
Left voicemail for the patient to call the office regarding CT results.

## 2018-04-26 ENCOUNTER — Encounter (HOSPITAL_COMMUNITY): Payer: Self-pay

## 2018-04-26 ENCOUNTER — Other Ambulatory Visit: Payer: Self-pay

## 2018-04-26 ENCOUNTER — Emergency Department (HOSPITAL_COMMUNITY)
Admission: EM | Admit: 2018-04-26 | Discharge: 2018-04-26 | Disposition: A | Discharge status: Home / Self Care | Payer: 59 | Attending: Emergency Medicine | Admitting: Emergency Medicine

## 2018-04-26 ENCOUNTER — Ambulatory Visit (HOSPITAL_BASED_OUTPATIENT_CLINIC_OR_DEPARTMENT_OTHER)
Admission: RE | Admit: 2018-04-26 | Discharge: 2018-04-26 | Disposition: A | Discharge status: Home / Self Care | Payer: 59 | Source: Ambulatory Visit | Attending: Cardiology | Admitting: Cardiology

## 2018-04-26 DIAGNOSIS — I2693 Single subsegmental pulmonary embolism without acute cor pulmonale: Secondary | ICD-10-CM | POA: Diagnosis not present

## 2018-04-26 DIAGNOSIS — R7989 Other specified abnormal findings of blood chemistry: Secondary | ICD-10-CM | POA: Diagnosis not present

## 2018-04-26 DIAGNOSIS — I2782 Chronic pulmonary embolism: Secondary | ICD-10-CM

## 2018-04-26 DIAGNOSIS — I824Y9 Acute embolism and thrombosis of unspecified deep veins of unspecified proximal lower extremity: Secondary | ICD-10-CM

## 2018-04-26 DIAGNOSIS — I82402 Acute embolism and thrombosis of unspecified deep veins of left lower extremity: Secondary | ICD-10-CM | POA: Diagnosis present

## 2018-04-26 DIAGNOSIS — I824Y2 Acute embolism and thrombosis of unspecified deep veins of left proximal lower extremity: Secondary | ICD-10-CM | POA: Diagnosis not present

## 2018-04-26 DIAGNOSIS — Z7901 Long term (current) use of anticoagulants: Secondary | ICD-10-CM | POA: Diagnosis not present

## 2018-04-26 LAB — BASIC METABOLIC PANEL
ANION GAP: 10 (ref 5–15)
BUN: 9 mg/dL (ref 6–20)
CHLORIDE: 103 mmol/L (ref 98–111)
CO2: 26 mmol/L (ref 22–32)
Calcium: 9.2 mg/dL (ref 8.9–10.3)
Creatinine, Ser: 1.42 mg/dL — ABNORMAL HIGH (ref 0.61–1.24)
GFR calc Af Amer: >60 mL/min (ref >60)
GFR, EST NON AFRICAN AMERICAN: 58 mL/min — AB (ref >60)
Glucose, Bld: 115 mg/dL — ABNORMAL HIGH (ref 70–99)
POTASSIUM: 3.6 mmol/L (ref 3.5–5.1)
Sodium: 139 mmol/L (ref 135–145)

## 2018-04-26 LAB — PROTIME-INR
INR: 1.12
PROTHROMBIN TIME: 14.3 s (ref 11.4–15.2)

## 2018-04-26 LAB — I-STAT TROPONIN, ED: Troponin i, poc: 0.01 ng/mL (ref 0.00–0.08)

## 2018-04-26 LAB — CBC
HEMATOCRIT: 45.2 % (ref 39.0–52.0)
HEMOGLOBIN: 14.9 g/dL (ref 13.0–17.0)
MCH: 29.4 pg (ref 26.0–34.0)
MCHC: 33 g/dL (ref 30.0–36.0)
MCV: 89.2 fL (ref 78.0–100.0)
Platelets: 211 10*3/uL (ref 150–400)
RBC: 5.07 MIL/uL (ref 4.22–5.81)
RDW: 13.3 % (ref 11.5–15.5)
WBC: 5.7 10*3/uL (ref 4.0–10.5)

## 2018-04-26 NOTE — Progress Notes (Addendum)
Lower Extremity venous duplex performed    Final Interpretation: Right: There is no evidence of deep vein thrombosis in the lower extremity. No cystic structure found in the popliteal fossa. 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 verbal report called to his referring physician  Per Dr Dulce Sellar  04/26/18 Sinda Du

## 2018-04-26 NOTE — ED Provider Notes (Signed)
Transfer of Care Note I assumed care of patient from [facility] at 1600.  Agree with history, physical exam and plan.  See their note for further details.    Briefly, the patient is a 47yo male who presents with PE and LLE DVT found on outside work-up today while undergoing preop clearance for a torn ACL.  Found to have an subacute or chronic small right PE and extensive extensive DVT of LLE.  Unclear timing however likely ongoing for the last 10 months.  Patient has recevied 2 doses of Eliquis thus far.  Hemodynamically stable.  Awaiting vascular recommendations.  Current plan is as follows: vascular surgery consult  Reassessment: I personally reassessed the patient.    Vascular evaluated patient at bedside who states patient is stable for discharge home.  Vascular recommends outpatient treatment with oral anticoagulation and aggressive leg elevation.  He will arrange follow-up for venous duplex scan in 3 months and will see him back at that time.  Updated patient with plan who verbalized his understanding.  Patient has Rx at home for Eliquis.  Instructed patient to follow-up with vascular and PCP.    Old records reviewed.  Imaging and labs reviewed and interpreted by myself and attending and used in the MDM.  Addressed patient question and concerns.  Reviewed discharged instructions with strict precautions given.  Advised patient to schedule follow-up with primary care provider.  Patient verbalized understanding and agrees with plan.  Patient stable at discharge.  The care of this patient was supervised by Dr. Ethelda Chick, who agreed with the plan and management of the patient.   Clinical Impression: 1. Acute deep vein thrombosis (DVT) of proximal vein of left lower extremity (HCC)   2. Single subsegmental pulmonary embolism without acute cor pulmonale     ED Dispo: Discharge     Abelardo Diesel, MD 04/27/18 [id number]    Doug Sou, MD 04/29/18 1147

## 2018-04-26 NOTE — Discharge Instructions (Addendum)
Follow up with primary care and vascular.  Continue to take Eliquis as prescribed.  Return to the ED for any worsening or other concerns including worsening shortness of breath or chest pain.

## 2018-04-26 NOTE — ED Provider Notes (Signed)
[facility] EMERGENCY DEPARTMENT Provider Note   CSN: [id number] Arrival date & time: 04/26/18  1331     History   Chief Complaint No chief complaint on file.   HPI Jonathan Archer is a 47 y.o. male.  Patient presents the emergency department today with significant left lower extremity DVT with a small right-sided blood clot noted in the lungs.  Patient was undergoing preop evaluation for left ACL repair.  Patient has been having some intermittent pain and swelling in his left leg.  This is been chronic in nature.  Patient does a lot of driving, max duration 2 hours.  He states that he had bronchitis several months ago and has had some exertional shortness of breath since that time.  He just related this to being "out of shape".  He denies any significant chest pain at the current time.  Patient had a d-dimer drawn which was elevated.  He was then followed up with a lower extremity Doppler which showed complete obstruction of the left deep venous system.  He had a CT of the chest performed which demonstrated a blood clot in the right long.  Patient was sent to the emergency department after reported phone call from cardiology to vascular surgery.  Patient has no shortness of breath at rest.  He denies fevers, lightheadedness or syncope.  Patient has a history of DVT in the left leg which occurred after a knee surgery 10 to 15 years ago.  Patient was treated with anticoagulation for several months and then discontinued.  Patient's father had a saddle pulmonary embolism in past after complications from a thrombo-lytics.  He was started on Eliquis yesterday and has taken a total of 2 doses.     Past Medical History:  Diagnosis Date  . ED (erectile dysfunction)   . Hypogonadism male     Patient Active Problem List   Diagnosis Date Noted  . Pre-operative cardiovascular examination 04/24/2018  . Mixed dyslipidemia 04/24/2018  . 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 Family history  of heart disease in male family member before age 91 04/12/2018  . Pain in left knee 10/17/2017  . Allergic rhinitis due to pollen 01/12/2015  . Arthritis of hip 09/14/2011    Past Surgical History:  Procedure Laterality Date  . MENISECTOMY  2006        Home Medications    Prior to Admission medications   Medication Sig Start Date End Date Taking? Authorizing Provider  albuterol (PROVENTIL HFA;VENTOLIN HFA) 108 (90 Base) MCG/ACT inhaler Inhale 2 puffs into the lungs every 6 (six) hours as needed for wheezing or shortness of breath. 01/08/18   Ronnald Nian, MD  apixaban (ELIQUIS) 5 MG TABS tablet Take 1 tablet (5 mg total) by mouth 2 (two) times daily. 04/25/18   Revankar, Aundra Dubin, MD  ibuprofen (ADVIL,MOTRIN) 200 MG tablet Take 200 mg by mouth every 6 (six) hours as needed.    [provider]    Family History Family History  Problem Relation Age of Onset  . Heart disease Sister   . Heart disease Mother   . Heart disease Father     Social History Social History   Tobacco Use  . Smoking status: Never Smoker  . Smokeless tobacco: Never Used  Substance Use Topics  . Alcohol use: Not on file  . Drug use: Not on file     Allergies   Vicodin [hydrocodone-acetaminophen]   Review of Systems Review of Systems  Constitutional: Negative  for fever.  HENT: Negative for rhinorrhea and sore throat.   Eyes: Negative for redness.  Respiratory: Positive for shortness of breath. Negative for cough.   Cardiovascular: Positive for leg swelling. Negative for chest pain.  Gastrointestinal: Negative for abdominal pain, diarrhea, nausea and vomiting.  Genitourinary: Negative for dysuria.  Musculoskeletal: Positive for myalgias.  Skin: Negative for rash.  Neurological: Negative for headaches.     Physical Exam Updated Vital Signs BP 120/82   Pulse (!) 59   Temp 98.3 F (36.8 C) (Oral)   Resp (!) 21   Ht 5\' 10"  (1.778 m)   Wt 89.4 kg   SpO2 98%   BMI 28.27  kg/m   Physical Exam  Constitutional: He appears well-developed and well-nourished.  HENT:  Head: Normocephalic and atraumatic.  Mouth/Throat: Oropharynx is clear and moist and mucous membranes are normal. Mucous membranes are not dry.  Eyes: Conjunctivae are normal.  Neck: Trachea normal and normal range of motion. Neck supple. Normal carotid pulses and no JVD present. No muscular tenderness present. Carotid bruit is not present. No tracheal deviation present.  Cardiovascular: Normal rate, regular rhythm, S1 normal, S2 normal, normal heart sounds and intact distal pulses. Exam reveals no distant heart sounds and no decreased pulses.  No murmur heard. Pulmonary/Chest: Effort normal and breath sounds normal. No respiratory distress. He has no wheezes. He exhibits no tenderness.  Abdominal: Soft. Normal aorta and bowel sounds are normal. There is no tenderness. There is no rebound and no guarding.  Musculoskeletal: He exhibits tenderness. He exhibits no edema.  Patient with mild generalized edema of the left leg without significant tenderness.  2+ pedal pulses.  Neurological: He is alert.  Skin: Skin is warm and dry. He is not diaphoretic. No cyanosis. No pallor.  Psychiatric: He has a normal mood and affect.  Nursing note and vitals reviewed.    ED Treatments / Results  Labs (all labs ordered are listed, but only abnormal results are displayed) Labs Reviewed  BASIC METABOLIC PANEL - Abnormal; Notable for the following components:      Result Value   Glucose, Bld 115 (*)    Creatinine, Ser 1.42 (*)    GFR calc non Af Amer 58 (*)    All other components within normal limits  CBC  PROTIME-INR  I-STAT TROPONIN, ED    EKG None  Radiology Ct Angio Chest Pe W Or Wo Contrast  Result Date: 04/25/2018 CLINICAL DATA:  Evaluate for pulmonary embolus. Chest pain and shortness of breath. Elevated D-dimer. EXAM: CT ANGIOGRAPHY CHEST WITH CONTRAST TECHNIQUE: Multidetector CT imaging of the  chest was performed using the standard protocol during bolus administration of intravenous contrast. Multiplanar CT image reconstructions and MIPs were obtained to evaluate the vascular anatomy. CONTRAST:  ISOVUE-370 IOPAMIDOL (ISOVUE-370) INJECTION 76% COMPARISON:  1/31/7 FINDINGS: Cardiovascular: The heart size is mildly enlarged. No pericardial effusion. Normal appearance of the main pulmonary artery. Eccentric filling defect within the right lower lobe lobar pulmonary artery is identified, image 75/8, favor chronic pulmonary embolus. No centrally located pulmonary artery filling defects identified compatible with acute pulmonary embolus. Mediastinum/Nodes: No enlarged mediastinal, hilar, or axillary lymph nodes. Thyroid gland, trachea, and esophagus demonstrate no significant findings. Lungs/Pleura: No pleural effusion. No airspace opacities. No atelectasis or pneumothorax identified. Upper Abdomen: No acute abnormality. Musculoskeletal: No chest wall abnormality. No acute or significant osseous findings. Review of the MIP images confirms the above findings. IMPRESSION: 1. 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 No evidence for acute pulmonary emboli identified. Electronically Signed   By: Signa Kell M.D.   On: 04/25/2018 12:09    Procedures Procedures (including critical care time)  Medications Ordered in ED Medications - No data to display   Initial Impression / Assessment and Plan / ED Course  I have reviewed the triage vital signs and the nursing notes.  Pertinent labs & imaging results that were available during my care of the patient were reviewed by me and considered in my medical decision making (see chart for details).     Patient seen and examined.  Reviewed prior work-up.  Spoke with  Dr. Edilia Bo on-call for vascular surgery.  He will send APP to evaluate the patient.  Awaiting recommendations.  Vital signs reviewed and are as follows: BP 120/82   Pulse (!) 59   Temp 98.3 F (36.8 C) (Oral)   Resp (!) 21   Ht 5\' 10"  (1.778 m)   Wt 89.4 kg   SpO2 98%   BMI 28.27 kg/m   Handoff to oncoming team at shift change. Awaiting vascular surgery reccs.   Final Clinical Impressions(s) / ED Diagnoses   Final diagnoses:  Acute deep vein thrombosis (DVT) of proximal vein of left lower extremity (HCC)  Single subsegmental pulmonary embolism without acute cor pulmonale   Awaiting vascular surgery recommendations.   ED Discharge Orders    None       Renne Crigler, Cordelia Poche 04/26/18 1526    Wynetta Fines, MD 04/27/18 1150

## 2018-04-26 NOTE — Progress Notes (Addendum)
Based upon the results of the CTA chest and lower extremity doppler per Dr. Tomie China and the recommendation of Dr. Darcella Cheshire office patient should report to [facility] ED. Patient is now on his way with his significant other. Triage RN has been informed.

## 2018-04-26 NOTE — ED Provider Notes (Signed)
Patient placed in Quick Look pathway, seen and evaluated   Chief Complaint: Positive PE/DVT  HPI:   Patient sent from cardiology.  Went to cardiologist on Tuesday for preop clearance for ACL repair of left knee.  D-dimer elevated, CTA yesterday showed multiple PEs.  Started on Eliquis yesterday.  DVT study today showed Complete Obstruction of left deep venous system.  Notes dyspnea on exertion, left leg swelling, and intermittent left-sided sharp chest pains for the past few days.  Prior history of DVT and PE after surgery 15 years ago.  ROS: CV: Chest pain, dyspnea on exertion, leg swelling  Physical Exam:   Gen: No distress  Neuro: Awake and Alert  Skin: Warm    Focused Exam: Heart regular rate and rhythm no murmurs rubs or gallops.  Lungs clear to auscultation bilaterally.  Left lower leg mildly swollen compared to the right.  Homans sign absent bilaterally.  No pitting edema.  2+ radial and DP/PT pulses bilaterally.  Compartments are soft   Initiation of care has begun. The patient has been counseled on the process, plan, and necessity for staying for the completion/evaluation, and the remainder of the medical screening examination    Jeanie Sewer, PA-C 04/26/18 1357    Wynetta Fines, MD 04/27/18 1150

## 2018-04-26 NOTE — Telephone Encounter (Signed)
Left voicemail for the patient to call the office back regarding testing and surgery. Patient must be bridged once surgery clearance is given.

## 2018-04-26 NOTE — ED Triage Notes (Signed)
Pt presents with +PE via CT scan that was found today.  Pt was at cardiology office for pre-op clearance for torn ACL to L leg.  +shortness of breath with exertion; pt also has multiple clots to L leg.  Pt was started on eliquis.

## 2018-04-26 NOTE — ED Notes (Signed)
Pt stable, ambulatory, states understanding of discharge instructions 

## 2018-04-26 NOTE — Consult Note (Addendum)
Hospital Consult  VASCULAR SURGERY ASSESSMENT & PLAN:   LEFT LOWER EXTREMITY DVT AND SMALL PULMONARY EMBOLUS: This patient has a DVT that extends from his tibial veins to the common femoral vein.  He does not have significant left lower extremity swelling currently.  It is difficult to determine how long he has had this clot based on his duplex and also based on his history.  He is been having swelling in the left leg since mid September but it had some chronic swelling in the leg for 10 months because of his injury the sustained while playing soccer.  He was not on anticoagulation and I agree that he does not require placement of an IVC filter.  Given that it is not clear when he developed his clot and he has minimal leg swelling currently I do not think thrombolysis is indicated, however we have discussed this option.  He is agreeable to outpatient treatment with oral anticoagulation and aggressive leg elevation.  I will arrange for a follow-up venous duplex scan in 3 months and I will see him back at that time.  Waverly Ferrari, MD, FACS Beeper [phone] Office: [phone]  Reason for Consult:  Extensive DVT LLE and subacute PE Requesting Physician:  ED MRN #:  [id number]  History of Present Illness: This is a 47 y.o. male with past medical history significant for left lower extremity DVT about 10 to 15 years ago related to a left knee surgery.  Patient states he injured his left ACL about 10 months ago and was being evaluated by his cardiologist on Tuesday for preoperative clearance before ACL repair.  He had been having some chest pain and has noticed shortness of breath on exertion over the past couple months ever since he was diagnosed with bronchitis.  He attributed this to being out of shape having injured his left ACL.  CTA of chest was performed yesterday which demonstrated a small subacute right lung pulmonary embolism.  He was started on Eliquis last night and took a second dose this  morning.  Venous duplex performed today demonstrates complete occlusion of left venous system extending from tibial veins to common femoral vein.  Patient states he has been experiencing edema of his left leg over the past 10 months and thought it was related to his knee injury.  Later in our conversation he did recall about 3 weeks ago experiencing more edema than usual after playing in a golf tournament.  He denies tobacco use.    Past Medical History:  Diagnosis Date  . ED (erectile dysfunction)   . Hypogonadism male     Past Surgical History:  Procedure Laterality Date  . MENISECTOMY  2006    Allergies  Allergen Reactions  . Vicodin [Hydrocodone-Acetaminophen]     Prior to Admission medications   Medication Sig Start Date End Date Taking? Authorizing Provider  albuterol (PROVENTIL HFA;VENTOLIN HFA) 108 (90 Base) MCG/ACT inhaler Inhale 2 puffs into the lungs every 6 (six) hours as needed for wheezing or shortness of breath. 01/08/18   Ronnald Nian, MD  apixaban (ELIQUIS) 5 MG TABS tablet Take 1 tablet (5 mg total) by mouth 2 (two) times daily. 04/25/18   Revankar, Aundra Dubin, MD  ibuprofen (ADVIL,MOTRIN) 200 MG tablet Take 200 mg by mouth every 6 (six) hours as needed.    [provider]    Social History   Socioeconomic History  . Marital status: Single    Spouse name: Not on file  . Number of  children: Not on file  . Years of education: Not on file  . Highest education level: Not on file  Occupational History  . Not on file  Social Needs  . Financial resource strain: Not on file  . Food insecurity:    Worry: Not on file    Inability: Not on file  . Transportation needs:    Medical: Not on file    Non-medical: Not on file  Tobacco Use  . Smoking status: Never Smoker  . Smokeless tobacco: Never Used  Substance and Sexual Activity  . Alcohol use: Not on file  . Drug use: Not on file  . Sexual activity: Not on file  Lifestyle  . Physical activity:    Days  per week: Not on file    Minutes per session: Not on file  . Stress: Not on file  Relationships  . Social connections:    Talks on phone: Not on file    Gets together: Not on file    Attends religious service: Not on file    Active member of club or organization: Not on file    Attends meetings of clubs or organizations: Not on file    Relationship status: Not on file  . Intimate partner violence:    Fear of current or ex partner: Not on file    Emotionally abused: Not on file    Physically abused: Not on file    Forced sexual activity: Not on file  Other Topics Concern  . Not on file  Social History Narrative  . Not on file     Family History  Problem Relation Age of Onset  . Heart disease Sister   . Heart disease Mother   . Heart disease Father     ROS: Otherwise negative unless mentioned in HPI  Physical Examination  Vitals:   04/26/18 1334 04/26/18 1423  BP: (!) 135/96 120/82  Pulse: (!) 52 (!) 59  Resp: 18 (!) 21  Temp: 98.3 F (36.8 C)   SpO2: 100% 98%   Body mass index is 28.27 kg/m.  General:  WDWN in NAD Gait: Not observed HENT: WNL, normocephalic Pulmonary: normal non-labored breathing, without Rales, rhonchi,  wheezing Cardiac: regular Abdomen: soft, NT/ND, no masses Skin: without rashes Vascular Exam/Pulses: Symmetrical radial pulses; symmetrical DP pulses Extremities: without ischemic changes, without Gangrene , without cellulitis; without open wounds; generalized edema left lower externally compared to right; no left calf pain; no sign of superficial thrombophlebitis Musculoskeletal: no muscle wasting or atrophy  Neurologic: A&O X 3;  No focal weakness or paresthesias are detected; speech is fluent/normal Psychiatric:  The pt has Normal affect. Lymph:  Unremarkable  CBC    Component Value Date/Time   WBC 5.7 04/26/2018 1436   RBC 5.07 04/26/2018 1436   HGB 14.9 04/26/2018 1436   HGB 15.1 01/07/2018 1453   HCT 45.2 04/26/2018 1436   HCT  44.1 01/07/2018 1453   PLT 211 04/26/2018 1436   PLT 185 01/07/2018 1453   MCV 89.2 04/26/2018 1436   MCV 88 01/07/2018 1453   MCH 29.4 04/26/2018 1436   MCHC 33.0 04/26/2018 1436   RDW 13.3 04/26/2018 1436   RDW 14.6 01/07/2018 1453   LYMPHSABS 1.1 01/07/2018 1453   MONOABS 426 02/03/2016 1012   EOSABS 0.1 01/07/2018 1453   BASOSABS 0.0 01/07/2018 1453    BMET    Component Value Date/Time   NA 139 04/26/2018 1436   NA 140 01/07/2018 1453   K 3.6  04/26/2018 1436   CL 103 04/26/2018 1436   CO2 26 04/26/2018 1436   GLUCOSE 115 (H) 04/26/2018 1436   BUN 9 04/26/2018 1436   BUN 11 01/07/2018 1453   CREATININE 1.42 (H) 04/26/2018 1436   CREATININE 1.49 (H) 02/03/2016 1012   CALCIUM 9.2 04/26/2018 1436   GFRNONAA 58 (L) 04/26/2018 1436   GFRAA >60 04/26/2018 1436    COAGS: Lab Results  Component Value Date   INR 1.12 04/26/2018     Non-Invasive Vascular Imaging:   Preliminary result of left lower extremity venous duplex demonstrates complete occlusion of the venous system extending from tibial veins distally to common femoral vein proximally.  Likely chronicity not specified  CTA CHEST 04/25/18 IMPRESSION: 1. 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 No evidence for acute pulmonary emboli identified.   ASSESSMENT/PLAN: This is a 47 y.o. male with subacute to chronic small right pulmonary embolus and extensive left lower extremity DVT likely subacute to chronic  - Given history of 10 months of left lower extremity swelling with potentially increase in swelling over the past month timing of DVT is unclear - Despite the patient's young age and active lifestyle, venous thrombolysis might not be an appropriate treatment plan given the chronicity of the patient's symptoms -Left lower  extremity DVT occurred while not anticoagulated thus I would not recommend IVC filter placement for this reason - Educated patient on elevation and compression likely lifelong to combat edema - On-call vascular surgeon Dr. Edilia Bo will evaluate the patient later today and provide further recommendations   Emilie Rutter PA-C [facility] [phone]

## 2018-04-30 ENCOUNTER — Telehealth (HOSPITAL_COMMUNITY): Payer: Self-pay

## 2018-04-30 ENCOUNTER — Telehealth: Payer: Self-pay | Admitting: Vascular Surgery

## 2018-04-30 NOTE — Telephone Encounter (Signed)
Message on the pt's cell phone with instructions for his Nuclear test on 05/02/2018. Jonathan Archer EMTP

## 2018-04-30 NOTE — Telephone Encounter (Signed)
sch appt pt OOT mld ltr 05/01/19 1pm LE DVT 145pm f/u MD

## 2018-05-02 ENCOUNTER — Encounter (HOSPITAL_COMMUNITY): Payer: 59

## 2018-05-07 ENCOUNTER — Encounter: Payer: Self-pay | Admitting: Family Medicine

## 2018-05-07 ENCOUNTER — Ambulatory Visit (INDEPENDENT_AMBULATORY_CARE_PROVIDER_SITE_OTHER): Payer: 59 | Admitting: Family Medicine

## 2018-05-07 VITALS — BP 130/82 | HR 60 | Temp 97.9°F | Wt 198.6 lb

## 2018-05-07 DIAGNOSIS — S83512A Sprain of anterior cruciate ligament of left knee, initial encounter: Secondary | ICD-10-CM

## 2018-05-07 DIAGNOSIS — I2699 Other pulmonary embolism without acute cor pulmonale: Secondary | ICD-10-CM

## 2018-05-07 NOTE — Progress Notes (Signed)
   Subjective:    Patient ID: Jonathan Archer, male    DOB: [birth year], 47 y.o.   MRN: [id number]  HPI He is here for follow-up visit.  He was referred to cardiology due to a positive family history of heart disease prior to having ACL repair.  His prior to that evaluation a d-dimer was done which was positive and subsequent evaluation showed a PE as well as DVT.  Presently he is taking Eliquis.  He does complain of some swelling in the leg but is having no chest pain and only minimal shortness of breath.  He was seen by Dr. Edilia Bo concerning this.  Does have a follow-up appointment with him in approximately 3 months.   Review of Systems     Objective:   Physical Exam Alert and in no distress.  Left calf does show some slight enlargement compared to the right.  It is not warm or tender.       Assessment & Plan:  Rupture of anterior cruciate ligament of left knee, initial encounter  Pulmonary embolism without acute cor pulmonale, unspecified chronicity, unspecified pulmonary embolism type [facility]) He will follow-up with Dr. Edilia Bo and eventually have his ACL repair.

## 2018-05-07 NOTE — Patient Instructions (Signed)
Go ahead and use the compression socks as well as the thigh compression.  Keep your leg elevated as much as you can.  Heat to your leg for 20 minutes 3 times per day.  Tylenol for pain relief.  If he gets more swelling and pain in your leg, shortness of breath or chest pain, do not call me!  Call EMS

## 2018-05-08 ENCOUNTER — Ambulatory Visit (INDEPENDENT_AMBULATORY_CARE_PROVIDER_SITE_OTHER): Payer: 59 | Admitting: Cardiology

## 2018-05-08 ENCOUNTER — Encounter: Payer: Self-pay | Admitting: Cardiology

## 2018-05-08 ENCOUNTER — Encounter: Payer: Self-pay | Admitting: *Deleted

## 2018-05-08 ENCOUNTER — Telehealth: Payer: Self-pay | Admitting: Cardiology

## 2018-05-08 VITALS — BP 120/88 | HR 62 | Ht 70.0 in | Wt 197.8 lb

## 2018-05-08 DIAGNOSIS — R079 Chest pain, unspecified: Secondary | ICD-10-CM | POA: Diagnosis not present

## 2018-05-08 DIAGNOSIS — I82419 Acute embolism and thrombosis of unspecified femoral vein: Secondary | ICD-10-CM

## 2018-05-08 DIAGNOSIS — I4891 Unspecified atrial fibrillation: Secondary | ICD-10-CM | POA: Insufficient documentation

## 2018-05-08 DIAGNOSIS — D6869 Other thrombophilia: Secondary | ICD-10-CM

## 2018-05-08 DIAGNOSIS — I2782 Chronic pulmonary embolism: Secondary | ICD-10-CM

## 2018-05-08 DIAGNOSIS — I82409 Acute embolism and thrombosis of unspecified deep veins of unspecified lower extremity: Secondary | ICD-10-CM | POA: Insufficient documentation

## 2018-05-08 DIAGNOSIS — I2699 Other pulmonary embolism without acute cor pulmonale: Secondary | ICD-10-CM | POA: Insufficient documentation

## 2018-05-08 HISTORY — DX: Other pulmonary embolism without acute cor pulmonale: I26.99

## 2018-05-08 HISTORY — DX: Other thrombophilia: D68.69

## 2018-05-08 HISTORY — DX: Unspecified atrial fibrillation: I48.91

## 2018-05-08 LAB — TROPONIN T

## 2018-05-08 NOTE — Telephone Encounter (Signed)
Pt c/o of Chest Pain: STAT if CP now or developed within 24 hours  1. Are you having CP right now? Constant chest pain under the left  breast  2. Are you experiencing any other symptoms (ex. SOB, nausea, vomiting, sweating)? No nausea and vomiting, but has been coughing really bad  3. How long have you been experiencing CP? 1 day 4. Is your CP continuous or coming and going? Constant   5. Have you taken Nitroglycerin? Not given nitro  ?Pt c/o of Chest Pain: STAT if CP now or developed within 24 hours  Patient has recently been treated for blood clot and is on Eliquis as we speak.Marland Kitchen  ?

## 2018-05-08 NOTE — Progress Notes (Signed)
Cardiology Office Note:    Date:  05/08/2018   ID:  Jonathan Archer, DOB Jan 03, 1971, MRN [id number]  PCP:  Ronnald Nian, MD  Cardiologist:  Norman Herrlich, MD    Referring MD: Ronnald Nian, MD    ASSESSMENT:    1. Chest pain, unspecified type   2. Other chronic pulmonary embolism, unspecified whether acute cor pulmonale present (HCC)   3. Deep vein thrombosis (DVT) of femoral vein, unspecified chronicity, unspecified laterality (HCC)   4. Hypercoagulability due to atrial fibrillation (HCC)    PLAN:    In order of problems listed above:  1. Atypical nonanginal chest pain he has some associated chest wall tenderness which can be seen with pulmonary embolism EKG is unchanged nonischemic check troponin and arrange for myocardial perfusion study. 2. Continue current anticoagulant 3. Continue anticoagulant increase the DVT dosage to finish the first week 4. Clearly hypercoagulable family history and initial event occurring with minor arthroscopic knee surgery he should remain on long-term anticoagulant reduced dose after 3 months.   Next appointment: 3 months   Medication Adjustments/Labs and Tests Ordered: Current medicines are reviewed at length with the patient today.  Concerns regarding medicines are outlined above.  Orders Placed This Encounter  Procedures  . Troponin I  . MYOCARDIAL PERFUSION IMAGING  . EKG 12-Lead   No orders of the defined types were placed in this encounter.   No chief complaint on file.   History of Present Illness:    Jonathan Archer is a 47 y.o. male with a hx of remote pulmonary embolism 2007 and recent evaluation showing subacute or chronic pulmonary embolism and chronic and acute deep vein thrombosis left lower extremity with a very elevated d-dimer level.  Last seen 04/24/18.  He had been wearing a knee brace which he thought was constricting his legs may have contributed to deep vein thrombosis.  His orthopedic surgery is canceled and told me  should be anticoagulated for 3 months and then reassess and I would repeat a duplex in his case prior to elective surgery should go back on anticoagulants postoperatively.  He has had some nonexertional chest pain left lower sternal border left chest some chest wall tenderness without physical effort and revealed relieved with rest.  His father has a history of CAD is elevated troponin drawn and be scheduled for myocardial perfusion study pharmacologically.  Despite extensive chronic thrombus left lower extremity he has not had a postphlebitic syndrome he has had no recent pain or swelling in the calf  Chest CT; IMPRESSION: 1. 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 No evidence for acute pulmonary emboli identified.  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   Compliance with diet, lifestyle and medications: Yes Past Medical History:  Diagnosis Date  . ED (erectile dysfunction)   . Hypogonadism male     Past Surgical History:  Procedure Laterality Date  . MENISECTOMY  2006    Current Medications: Current Meds  Medication Sig  . albuterol (PROVENTIL HFA;VENTOLIN HFA) 108 (90 Base) MCG/ACT inhaler  Inhale 2 puffs into the lungs every 6 (six) hours as needed for wheezing or shortness of breath.  Marland Kitchen apixaban (ELIQUIS) 5 MG TABS tablet Take 1 tablet (5 mg total) by mouth 2 (two) times daily.  . Menthol, Topical Analgesic, (BIOFREEZE EX) Apply 1 application topically as needed (leg pain).     Allergies:   Vicodin  [hydrocodone-acetaminophen]   Social History   Socioeconomic History  . Marital status: Single    Spouse name: Not on file  . Number of children: Not on file  . Years of education: Not on file  . Highest education level: Not on file  Occupational History  . Not on file  Social Needs  . Financial resource strain: Not on file  . Food insecurity:    Worry: Not on file    Inability: Not on file  . Transportation needs:    Medical: Not on file    Non-medical: Not on file  Tobacco Use  . Smoking status: Never Smoker  . Smokeless tobacco: Never Used  Substance and Sexual Activity  . Alcohol use: Yes    Comment: rarely  . Drug use: Yes    Types: Marijuana  . Sexual activity: Not on file  Lifestyle  . Physical activity:    Days per week: Not on file    Minutes per session: Not on file  . Stress: Not on file  Relationships  . Social connections:    Talks on phone: Not on file    Gets together: Not on file    Attends religious service: Not on file    Active member of club or organization: Not on file    Attends meetings of clubs or organizations: Not on file    Relationship status: Not on file  Other Topics Concern  . Not on file  Social History Narrative  . Not on file     Family History: The patient's family history includes Heart attack in his father, mother, and sister; Heart disease in his father, mother, and sister. ROS:   Please see the history of present illness.    All other systems reviewed and are negative.  EKGs/Labs/Other Studies Reviewed:    The following studies were reviewed today:  EKG:  EKG 04/23/18 with [facility] incomplete RBBB and lateral T wave inversion  Recent Labs: 01/07/2018: ALT 29; BNP 91.1 04/26/2018: BUN 9; Creatinine, Ser 1.42; Hemoglobin 14.9; Platelets 211; Potassium 3.6; Sodium 139  Recent Lipid Panel    Component Value Date/Time   CHOL 196 01/07/2018 1453   TRIG 123 01/07/2018 1453   HDL 48 01/07/2018 1453   CHOLHDL 4.1 01/07/2018  1453   CHOLHDL 3.6 01/12/2015 0001   VLDL 13 01/12/2015 0001   LDLCALC 123 (H) 01/07/2018 1453    Physical Exam:    VS:  BP 120/88 (BP Location: Right Arm, Patient Position: Sitting, Cuff Size: Normal)   Pulse 62   Ht 5\' 10"  (1.778 m)   Wt 197 lb 12.8 oz (89.7 kg)   SpO2 98%   BMI 28.38 kg/m     Wt Readings from Last 3 Encounters:  05/08/18 197 lb 12.8 oz (89.7 kg)  05/07/18 198 lb 9.6 oz (90.1 kg)  04/26/18 197 lb (89.4 kg)     GEN:  Well nourished, well developed in no acute distress HEENT: Normal NECK: No JVD; No carotid bruits LYMPHATICS: No lymphadenopathy CARDIAC: Tenderness to palpation left anterior chest wall costochondral junction RRR, no murmurs, rubs, gallops RESPIRATORY:  Clear to auscultation  without rales, wheezing or rhonchi  ABDOMEN: Soft, non-tender, non-distended MUSCULOSKELETAL:  No edema; No deformity  SKIN: Warm and dry NEUROLOGIC:  Alert and oriented x 3 PSYCHIATRIC:  Normal affect    Signed, Norman Herrlich, MD  05/08/2018 3:50 PM    [facility]

## 2018-05-08 NOTE — Patient Instructions (Signed)
Medication Instructions:  Your physician has recommended you make the following change in your medication:   CHANGE apixaban (eliquis) 5 mg: Take 2 tablets (10 mg) for two doses then decrease to 1 tablet (5 mg) twice daily: samples provided.   If you need a refill on your cardiac medications before your next appointment, please call your pharmacy.   Lab work: Your physician recommends that you return for lab work today: STAT troponin.   If you have labs (blood work) drawn today and your tests are completely normal, you will receive your results only by: Marland Kitchen MyChart Message (if you have MyChart) OR . A paper copy in the mail If you have any lab test that is abnormal or we need to change your treatment, we will call you to review the results.  Testing/Procedures: You had an EKG today.   Your physician has requested that you have a lexiscan myoview. For further information please visit [email or web address] Please follow instruction sheet, as given.  Follow-Up: At [facility], you and your health needs are our priority.  As part of our continuing mission to provide you with exceptional heart care, we have created designated Provider Care Teams.  These Care Teams include your primary Cardiologist (physician) and Advanced Practice Providers (APPs -  Physician Assistants and Nurse Practitioners) who all work together to provide you with the care you need, when you need it. . You will need a follow up appointment in 3 weeks.  Please call our office 2 months in advance to schedule this appointment.     Apixaban oral tablets What is this medicine? APIXABAN (a PIX a ban) is an anticoagulant (blood thinner). It is used to lower the chance of stroke in people with a medical condition called atrial fibrillation. It is also used to treat or prevent blood clots in the lungs or in the veins. This medicine may be used for other purposes; ask your health care provider or pharmacist if you have  questions. COMMON BRAND NAME(S): Eliquis What should I tell my health care provider before I take this medicine? They need to know if you have any of these conditions: -bleeding disorders -bleeding in the brain -blood in your stools (black or tarry stools) or if you have blood in your vomit -history of stomach bleeding -kidney disease -liver disease -mechanical heart valve -an unusual or allergic reaction to apixaban, other medicines, foods, dyes, or preservatives -pregnant or trying to get pregnant -breast-feeding How should I use this medicine? Take this medicine by mouth with a glass of water. Follow the directions on the prescription label. You can take it with or without food. If it upsets your stomach, take it with food. Take your medicine at regular intervals. Do not take it more often than directed. Do not stop taking except on your doctor's advice. Stopping this medicine may increase your risk of a blot clot. Be sure to refill your prescription before you run out of medicine. Talk to your pediatrician regarding the use of this medicine in children. Special care may be needed. Overdosage: If you think you have taken too much of this medicine contact a poison control center or emergency room at once. NOTE: This medicine is only for you. Do not share this medicine with others. What if I miss a dose? If you miss a dose, take it as soon as you can. If it is almost time for your next dose, take only that dose. Do not take double or extra doses. What may  interact with this medicine? This medicine may interact with the following: -aspirin and aspirin-like medicines -certain medicines for fungal infections like ketoconazole and itraconazole -certain medicines for seizures like carbamazepine and phenytoin -certain medicines that treat or prevent blood clots like warfarin, enoxaparin, and dalteparin -clarithromycin -NSAIDs, medicines for pain and inflammation, like ibuprofen or  naproxen -rifampin -ritonavir -St. John's wort This list may not describe all possible interactions. Give your health care provider a list of all the medicines, herbs, non-prescription drugs, or dietary supplements you use. Also tell them if you smoke, drink alcohol, or use illegal drugs. Some items may interact with your medicine. What should I watch for while using this medicine? Visit your doctor or health care professional for regular checks on your progress. Notify your doctor or health care professional and seek emergency treatment if you develop breathing problems; changes in vision; chest pain; severe, sudden headache; pain, swelling, warmth in the leg; trouble speaking; sudden numbness or weakness of the face, arm or leg. These can be signs that your condition has gotten worse. If you are going to have surgery or other procedure, tell your doctor that you are taking this medicine. What side effects may I notice from receiving this medicine? Side effects that you should report to your doctor or health care professional as soon as possible: -allergic reactions like skin rash, itching or hives, swelling of the face, lips, or tongue -signs and symptoms of bleeding such as bloody or black, tarry stools; red or dark-D[place]ola urine; spitting up blood or [place]ev[place] material that looks like coffee grounds; red spots on the skin; unusual bruising or bleeding from the eye, gums, or nose This list may not describe all possible side effects. Call your doctor for medical advice about side effects. You may report side effects to FDA at 1-800-FDA-1088. Where should I keep my medicine? Keep out of the reach of children. Store at room temperature between 20 and 25 degrees C (68 and 77 degrees F). Throw away any unused medicine after the expiration date. NOTE: This sheet is a summary. It may not cover all possible information. If you have questions about this medicine, talk to your doctor, pharmacist, or health care  provider.  2018 Elsevier/Gold Standard (2016-01-31 11:54:23)   Cardiac-Specific Troponin I and T Test Why am I having this test? You may have this test if you have experienced chest pain. The test can be used to determine if you have had a heart attack or injury to heart (cardiac) muscle. This test can also help predict the possibility of future heart attacks. This test measures the concentration of cardiac-specific troponin in your blood. Troponins are proteins that help muscles contract. There are three forms of troponin, including troponins C, I, and T. The types of troponins I and T that are found in cardiac muscle are different from the troponins I and T that are found in skeletal muscle. Therefore, testing can be done for cardiac-specific troponins I and T. These types of troponin are normally present in very small quantities in the blood. When there is damage to heart muscle cells, cardiac troponins I and T are released into circulation. The more damage there is, the greater the concentration of troponins I and T. When a person has a heart attack, levels of troponin can become elevated in the blood within 3-4 hours after injury and may remain elevated for 10-14 days. What kind of sample is taken? A blood sample is required for this test. It is usually  collected by inserting a needle into a vein. Usually, an initial blood sample is collected, and then another blood sample is collected 12 hours later. After these samples, you will have your blood tested daily for 3-5 days. You might also have it tested weekly for 5-6 weeks. How do I prepare for this test? There is no preparation required for this test. However, be aware that you will need to make arrangements to have your blood collected frequently. What are the reference ranges? Reference values are considered healthy values established after testing a large group of healthy people. Reference values may vary among different people, labs, and  hospitals. It is your responsibility to obtain your test results. Ask the lab or department performing the test when and how you will get your results. Reference values for cardiac troponins are as follows:  Cardiac troponin T: less than 0.1 ng/mL.  Cardiac troponin I: less than 0.03 ng/mL.  What do the results mean? Troponin values above the reference values may indicate:  Injury to the heart muscle.  Heart attack.  Talk with your health care provider to discuss your results, treatment options, and if necessary, the need for more tests. Talk with your health care provider if you have any questions about your results. Talk with your health care provider to discuss your results, treatment options, and if necessary, the need for more tests. Talk with your health care provider if you have any questions about your results. This information is not intended to replace advice given to you by your health care provider. Make sure you discuss any questions you have with your health care provider. Document Released: 08/12/2004 Document Revised: 03/14/2016 Document Reviewed: 12/03/2013 Elsevier Interactive Patient Education  2018 [employer].   Cardiac Nuclear Scan A cardiac nuclear scan is a test that measures blood flow to the heart when a person is resting and when he or she is exercising. The test looks for problems such as:  Not enough blood reaching a portion of the heart.  The heart muscle not working normally.  You may need this test if:  You have heart disease.  You have had abnormal lab results.  You have had heart surgery or angioplasty.  You have chest pain.  You have shortness of breath.  In this test, a radioactive dye (tracer) is injected into your bloodstream. After the tracer has traveled to your heart, an imaging device is used to measure how much of the tracer is absorbed by or distributed to various areas of your heart. This procedure is usually done at a hospital  and takes 2-4 hours. Tell a health care provider about:  Any allergies you have.  All medicines you are taking, including vitamins, herbs, eye drops, creams, and over-the-counter medicines.  Any problems you or family members have had with the use of anesthetic medicines.  Any blood disorders you have.  Any surgeries you have had.  Any medical conditions you have.  Whether you are pregnant or may be pregnant. What are the risks? Generally, this is a safe procedure. However, problems may occur, including:  Serious chest pain and heart attack. This is only a risk if the stress portion of the test is done.  Rapid heartbeat.  Sensation of warmth in your chest. This usually passes quickly.  What happens before the procedure?  Ask your health care provider about changing or stopping your regular medicines. This is especially important if you are taking diabetes medicines or blood thinners.  Remove your  jewelry on the day of the procedure. What happens during the procedure?  An IV tube will be inserted into one of your veins.  Your health care provider will inject a small amount of radioactive tracer through the tube.  You will wait for 20-40 minutes while the tracer travels through your bloodstream.  Your heart activity will be monitored with an electrocardiogram (ECG).  You will lie down on an exam table.  Images of your heart will be taken for about 15-20 minutes.  You may be asked to exercise on a treadmill or stationary bike. While you exercise, your heart's activity will be monitored with an ECG, and your blood pressure will be checked. If you are unable to exercise, you may be given a medicine to increase blood flow to parts of your heart.  When blood flow to your heart has peaked, a tracer will again be injected through the IV tube.  After 20-40 minutes, you will get back on the exam table and have more images taken of your heart.  When the procedure is over, your  IV tube will be removed. The procedure may vary among health care providers and hospitals. Depending on the type of tracer used, scans may need to be repeated 3-4 hours later. What happens after the procedure?  Unless your health care provider tells you otherwise, you may return to your normal schedule, including diet, activities, and medicines.  Unless your health care provider tells you otherwise, you may increase your fluid intake. This will help flush the contrast dye from your body. Drink enough fluid to keep your urine clear or pale yellow.  It is up to you to get your test results. Ask your health care provider, or the department that is doing the test, when your results will be ready. Summary  A cardiac nuclear scan measures the blood flow to the heart when a person is resting and when he or she is exercising.  You may need this test if you are at risk for heart disease.  Tell your health care provider if you are pregnant.  Unless your health care provider tells you otherwise, increase your fluid intake. This will help flush the contrast dye from your body. Drink enough fluid to keep your urine clear or pale yellow. This information is not intended to replace advice given to you by your health care provider. Make sure you discuss any questions you have with your health care provider. Document Released: 08/04/2004 Document Revised: 07/12/2016 Document Reviewed: 06/18/2013 Elsevier Interactive Patient Education  2017 [employer].

## 2018-05-08 NOTE — Telephone Encounter (Signed)
Left voicemail for the patient to call the office regarding urgent call.

## 2018-05-08 NOTE — Telephone Encounter (Signed)
Patient will be seen today in [place] with [facility] regarding concerns.

## 2018-05-14 ENCOUNTER — Telehealth (HOSPITAL_COMMUNITY): Payer: Self-pay

## 2018-05-14 NOTE — Telephone Encounter (Signed)
Encounter complete. 

## 2018-05-16 ENCOUNTER — Ambulatory Visit (HOSPITAL_COMMUNITY)
Admission: RE | Admit: 2018-05-16 | Discharge: 2018-05-16 | Disposition: A | Discharge status: Home / Self Care | Payer: 59 | Source: Ambulatory Visit | Attending: Cardiovascular Disease | Admitting: Cardiovascular Disease

## 2018-05-16 DIAGNOSIS — R079 Chest pain, unspecified: Secondary | ICD-10-CM | POA: Diagnosis present

## 2018-05-16 LAB — MYOCARDIAL PERFUSION IMAGING
CHL CUP NUCLEAR SDS: 0
CHL CUP RESTING HR STRESS: 50 {beats}/min
LVDIAVOL: 153 mL (ref 62–150)
LVSYSVOL: 78 mL
NUC STRESS TID: 1.1
Peak HR: 77 {beats}/min
SRS: 0
SSS: 0

## 2018-05-16 MED ORDER — TECHNETIUM TC 99M TETROFOSMIN IV KIT
10.7000 | PACK | Freq: Once | INTRAVENOUS | Status: AC | PRN
  Administered 2018-05-16: 10.7 via INTRAVENOUS
  Filled 2018-05-16: qty 11

## 2018-05-16 MED ORDER — REGADENOSON 0.4 MG/5ML IV SOLN
0.4000 mg | Freq: Once | INTRAVENOUS | Status: AC
  Administered 2018-05-16: 0.4 mg via INTRAVENOUS

## 2018-05-16 MED ORDER — TECHNETIUM TC 99M TETROFOSMIN IV KIT
32.4000 | PACK | Freq: Once | INTRAVENOUS | Status: AC | PRN
  Administered 2018-05-16: 32.4 via INTRAVENOUS
  Filled 2018-05-16: qty 33

## 2018-05-17 ENCOUNTER — Other Ambulatory Visit: Payer: Self-pay

## 2018-05-17 DIAGNOSIS — I82412 Acute embolism and thrombosis of left femoral vein: Secondary | ICD-10-CM

## 2018-06-04 ENCOUNTER — Ambulatory Visit: Payer: 59 | Admitting: Cardiology

## 2018-06-12 ENCOUNTER — Ambulatory Visit: Payer: 59 | Admitting: Cardiology

## 2018-06-24 ENCOUNTER — Encounter: Payer: Self-pay | Admitting: Cardiology

## 2018-06-24 ENCOUNTER — Ambulatory Visit (INDEPENDENT_AMBULATORY_CARE_PROVIDER_SITE_OTHER): Payer: 59 | Admitting: Cardiology

## 2018-06-24 DIAGNOSIS — R0789 Other chest pain: Secondary | ICD-10-CM

## 2018-06-24 DIAGNOSIS — Z86711 Personal history of pulmonary embolism: Secondary | ICD-10-CM

## 2018-06-24 HISTORY — DX: Other chest pain: R07.89

## 2018-06-24 HISTORY — DX: Personal history of pulmonary embolism: Z86.711

## 2018-06-24 NOTE — Patient Instructions (Signed)
Medication Instructions:  Your physician recommends that you continue on your current medications as directed. Please refer to the Current Medication list given to you today.  If you need a refill on your cardiac medications before your next appointment, please call your pharmacy.   Lab work: None  If you have labs (blood work) drawn today and your tests are completely normal, you will receive your results only by: . MyChart Message (if you have MyChart) OR . A paper copy in the mail If you have any lab test that is abnormal or we need to change your treatment, we will call you to review the results.  Testing/Procedures: None  Follow-Up: At [facility], you and your health needs are our priority.  As part of our continuing mission to provide you with exceptional heart care, we have created designated Provider Care Teams.  These Care Teams include your primary Cardiologist (physician) and Advanced Practice Providers (APPs -  Physician Assistants and Nurse Practitioners) who all work together to provide you with the care you need, when you need it.  You will need a follow up appointment in 6 months.  Please call our office 2 months in advance to schedule this appointment.  You may see another member of our [facility] Provider Team in [place]: Robert Krasowski, MD . Brian Munley, MD  Any Other Special Instructions Will Be Listed Below (If Applicable).    

## 2018-06-24 NOTE — Progress Notes (Signed)
Cardiology Office Note:    Date:  06/24/2018   ID:  Jonathan Archer, DOB 1971/02/20, MRN [id number]  PCP:  Ronnald Nian, MD  Cardiologist:  Garwin Brothers, MD   Referring MD: Ronnald Nian, MD    ASSESSMENT:    1. Atypical chest pain   2. History of pulmonary embolism    PLAN:    In order of problems listed above:  1. Primary prevention stressed with the patient.  Importance of compliance with diet and medication stressed he vocalized understanding.  His blood pressure is stable.  Anticoagulation benefits and potential risks were explained.  His chest pain is atypical for coronary etiology.  He uses compression stockings and foot elevation for his swelling as recommended by his vascular surgeon and it helps. 2. Patient will be seen in follow-up appointment in 6 months or earlier if the patient has any concerns    Medication Adjustments/Labs and Tests Ordered: Current medicines are reviewed at length with the patient today.  Concerns regarding medicines are outlined above.  No orders of the defined types were placed in this encounter.  No orders of the defined types were placed in this encounter.    No chief complaint on file.    History of Present Illness:    Jonathan Archer is a 47 y.o. male.  Patient has in the past been evaluated by me for chest pain.  He has history of DVT and pulmonary embolism.  Subsequently is done fine and is on anticoagulation followed by vascular surgery.  He recently saw my partner and underwent stress testing which did not reveal any evidence of ischemia.  At the time of my evaluation, the patient is alert awake oriented and in no distress.  Past Medical History:  Diagnosis Date  . ED (erectile dysfunction)   . Hypogonadism male     Past Surgical History:  Procedure Laterality Date  . MENISECTOMY  2006    Current Medications: Current Meds  Medication Sig  . albuterol (PROVENTIL HFA;VENTOLIN HFA) 108 (90 Base) MCG/ACT inhaler Inhale  2 puffs into the lungs every 6 (six) hours as needed for wheezing or shortness of breath.  Marland Kitchen apixaban (ELIQUIS) 5 MG TABS tablet Take 1 tablet (5 mg total) by mouth 2 (two) times daily.  . Menthol, Topical Analgesic, (BIOFREEZE EX) Apply 1 application topically as needed (leg pain).     Allergies:   Vicodin [hydrocodone-acetaminophen]   Social History   Socioeconomic History  . Marital status: Single    Spouse name: Not on file  . Number of children: Not on file  . Years of education: Not on file  . Highest education level: Not on file  Occupational History  . Not on file  Social Needs  . Financial resource strain: Not on file  . Food insecurity:    Worry: Not on file    Inability: Not on file  . Transportation needs:    Medical: Not on file    Non-medical: Not on file  Tobacco Use  . Smoking status: Never Smoker  . Smokeless tobacco: Never Used  Substance and Sexual Activity  . Alcohol use: Yes    Comment: rarely  . Drug use: Yes    Types: Marijuana  . Sexual activity: Not on file  Lifestyle  . Physical activity:    Days per week: Not on file    Minutes per session: Not on file  . Stress: Not on file  Relationships  . Social connections:  Talks on phone: Not on file    Gets together: Not on file    Attends religious service: Not on file    Active member of club or organization: Not on file    Attends meetings of clubs or organizations: Not on file    Relationship status: Not on file  Other Topics Concern  . Not on file  Social History Narrative  . Not on file     Family History: The patient's family history includes Heart attack in his father, mother, and sister; Heart disease in his father, mother, and sister.  ROS:   Please see the history of present illness.    All other systems reviewed and are negative.  EKGs/Labs/Other Studies Reviewed:    The following studies were reviewed today: I discussed my findings with the patient at extensive  length.   Recent Labs: 01/07/2018: ALT 29; BNP 91.1 04/26/2018: BUN 9; Creatinine, Ser 1.42; Hemoglobin 14.9; Platelets 211; Potassium 3.6; Sodium 139  Recent Lipid Panel    Component Value Date/Time   CHOL 196 01/07/2018 1453   TRIG 123 01/07/2018 1453   HDL 48 01/07/2018 1453   CHOLHDL 4.1 01/07/2018 1453   CHOLHDL 3.6 01/12/2015 0001   VLDL 13 01/12/2015 0001   LDLCALC 123 (H) 01/07/2018 1453    Physical Exam:    VS:  BP 138/76 (BP Location: Right Arm, Patient Position: Sitting, Cuff Size: Normal)   Pulse 64   Ht 5\' 10"  (1.778 m)   Wt 200 lb (90.7 kg)   SpO2 98%   BMI 28.70 kg/m     Wt Readings from Last 3 Encounters:  06/24/18 200 lb (90.7 kg)  05/16/18 197 lb (89.4 kg)  05/08/18 197 lb 12.8 oz (89.7 kg)     GEN: Patient is in no acute distress HEENT: Normal NECK: No JVD; No carotid bruits LYMPHATICS: No lymphadenopathy CARDIAC: Hear sounds regular, 2/6 systolic murmur at the apex. RESPIRATORY:  Clear to auscultation without rales, wheezing or rhonchi  ABDOMEN: Soft, non-tender, non-distended MUSCULOSKELETAL:  No edema; No deformity  SKIN: Warm and dry NEUROLOGIC:  Alert and oriented x 3 PSYCHIATRIC:  Normal affect   Signed, Garwin Brothersajan R Mychaela Lennartz, MD  06/24/2018 3:45 PM    [facility]

## 2018-06-26 ENCOUNTER — Emergency Department (HOSPITAL_BASED_OUTPATIENT_CLINIC_OR_DEPARTMENT_OTHER): Payer: 59

## 2018-06-26 ENCOUNTER — Encounter (HOSPITAL_BASED_OUTPATIENT_CLINIC_OR_DEPARTMENT_OTHER): Payer: Self-pay

## 2018-06-26 ENCOUNTER — Emergency Department (HOSPITAL_BASED_OUTPATIENT_CLINIC_OR_DEPARTMENT_OTHER)
Admission: EM | Admit: 2018-06-26 | Discharge: 2018-06-26 | Disposition: A | Discharge status: Home / Self Care | Payer: 59 | Attending: Emergency Medicine | Admitting: Emergency Medicine

## 2018-06-26 ENCOUNTER — Emergency Department (HOSPITAL_COMMUNITY): Payer: 59

## 2018-06-26 DIAGNOSIS — Z7901 Long term (current) use of anticoagulants: Secondary | ICD-10-CM | POA: Diagnosis not present

## 2018-06-26 DIAGNOSIS — I82512 Chronic embolism and thrombosis of left femoral vein: Secondary | ICD-10-CM

## 2018-06-26 DIAGNOSIS — R200 Anesthesia of skin: Secondary | ICD-10-CM | POA: Diagnosis not present

## 2018-06-26 DIAGNOSIS — I2782 Chronic pulmonary embolism: Secondary | ICD-10-CM

## 2018-06-26 DIAGNOSIS — R072 Precordial pain: Secondary | ICD-10-CM

## 2018-06-26 DIAGNOSIS — R202 Paresthesia of skin: Secondary | ICD-10-CM

## 2018-06-26 HISTORY — DX: Acute embolism and thrombosis of unspecified deep veins of unspecified lower extremity: I82.409

## 2018-06-26 HISTORY — DX: Personal history of pulmonary embolism: Z86.711

## 2018-06-26 LAB — BASIC METABOLIC PANEL
ANION GAP: 8 (ref 5–15)
BUN: 11 mg/dL (ref 6–20)
CALCIUM: 9.1 mg/dL (ref 8.9–10.3)
CO2: 23 mmol/L (ref 22–32)
Chloride: 104 mmol/L (ref 98–111)
Creatinine, Ser: 1.24 mg/dL (ref 0.61–1.24)
Glucose, Bld: 108 mg/dL — ABNORMAL HIGH (ref 70–99)
POTASSIUM: 4 mmol/L (ref 3.5–5.1)
Sodium: 135 mmol/L (ref 135–145)

## 2018-06-26 LAB — CBC
HCT: 46 % (ref 39.0–52.0)
HEMOGLOBIN: 15.2 g/dL (ref 13.0–17.0)
MCH: 29 pg (ref 26.0–34.0)
MCHC: 33 g/dL (ref 30.0–36.0)
MCV: 87.8 fL (ref 80.0–100.0)
PLATELETS: 219 10*3/uL (ref 150–400)
RBC: 5.24 MIL/uL (ref 4.22–5.81)
RDW: 13.5 % (ref 11.5–15.5)
WBC: 4.8 10*3/uL (ref 4.0–10.5)
nRBC: 0 % (ref 0.0–0.2)

## 2018-06-26 LAB — TROPONIN I

## 2018-06-26 LAB — I-STAT TROPONIN, ED: Troponin i, poc: 0.01 ng/mL (ref 0.00–0.08)

## 2018-06-26 MED ORDER — IOPAMIDOL (ISOVUE-370) INJECTION 76%
100.0000 mL | Freq: Once | INTRAVENOUS | Status: AC | PRN
  Administered 2018-06-26: 100 mL via INTRAVENOUS

## 2018-06-26 MED ORDER — IOPAMIDOL (ISOVUE-370) INJECTION 76%
INTRAVENOUS | Status: AC
  Filled 2018-06-26: qty 100

## 2018-06-26 NOTE — Discharge Instructions (Addendum)
[facility]

## 2018-06-26 NOTE — ED Provider Notes (Signed)
[facility] EMERGENCY DEPARTMENT Provider Note   CSN: [id number] Arrival date & time: 06/26/18  0813     History   Chief Complaint Chief Complaint  Patient presents with  . Chest Pain    HPI Winnifred FriarDavid P Cappuccio is a 47 y.o. male.  Pt presents to the ED today with left sided facial numbness.  The pt said he's been waking up with the left facial numbness for the past week, but it's been going away.  Today, it was not going away, but it is better now.  He will occasionally get numbness to his left arm and leg, but does not have that now.  He does not feel like he has any weakness in his muscles.  He denies trouble speaking or swallowing.  He was also recently diagnosed with PE and left leg DVT.  He has been on Eliquis since early October.  He has seen vascular and cardiology.  The pt has had intermittent CP since his PE was diagnosed.  He had a myocardial perfusion scan on 10/24 which was negative.  He saw his cardiologist, Dr. Tomie Chinaevankar on 12/2 and all was well.  The pt has had intermittent CP since his PE was diagnosed.     Past Medical History:  Diagnosis Date  . DVT (deep venous thrombosis) (HCC)   . ED (erectile dysfunction)   . History of pulmonary embolus (PE)   . Hypogonadism male     Patient Active Problem List   Diagnosis Date Noted  . Atypical chest pain 06/24/2018  . History of pulmonary embolism 06/24/2018  . Pulmonary embolism (HCC) 05/08/2018  . DVT (deep venous thrombosis) (HCC) 05/08/2018  . Hypercoagulability due to atrial fibrillation (HCC) 05/08/2018  . Pre-operative cardiovascular examination 04/24/2018  . Mixed dyslipidemia 04/24/2018  . 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 Family history of heart disease in male family member before age 47 04/12/2018  . Pain in left knee 10/17/2017  . Allergic rhinitis due to pollen 01/12/2015  . Arthritis of hip 09/14/2011    Past Surgical History:  Procedure Laterality Date  . MENISECTOMY  2006        Home  Medications    Prior to Admission medications   Medication Sig Start Date End Date Taking? Authorizing Provider  albuterol (PROVENTIL HFA;VENTOLIN HFA) 108 (90 Base) MCG/ACT inhaler Inhale 2 puffs into the lungs every 6 (six) hours as needed for wheezing or shortness of breath. 01/08/18   [name], [name], MD  apixaban (ELIQUIS) 5 MG TABS tablet Take 1 tablet (5 mg total) by mouth 2 (two) times daily. 04/25/18   Revankar, Aundra Dubinajan R, MD  Menthol, Topical Analgesic, (BIOFREEZE EX) Apply 1 application topically as needed (leg pain).    [provider]    Family History Family History  Problem Relation Age of Onset  . Heart disease Sister   . Heart attack Sister   . Heart disease Mother   . Heart attack Mother   . Heart disease Father   . Heart attack Father     Social History Social History   Tobacco Use  . Smoking status: Never Smoker  . Smokeless tobacco: Never Used  Substance Use Topics  . Alcohol use: Yes    Comment: rarely  . Drug use: Yes    Types: Marijuana     Allergies   Vicodin [hydrocodone-acetaminophen]   Review of Systems Review of Systems  Cardiovascular: Positive for chest pain.  Neurological: Positive for numbness.  All other systems reviewed  and are negative.    Physical Exam Updated Vital Signs BP 125/81   Pulse (!) 50   Temp 98.1 F (36.7 C) (Oral)   Resp 19   SpO2 98%   Physical Exam  Constitutional: He is oriented to person, place, and time. He appears well-developed and well-nourished.  HENT:  Head: Normocephalic and atraumatic.  Eyes: Pupils are equal, round, and reactive to light. EOM are normal.  Neck: Normal range of motion. Neck supple.  Cardiovascular: Normal rate, regular rhythm, intact distal pulses and normal pulses.  Pulmonary/Chest: Effort normal and breath sounds normal.  Abdominal: Soft. Bowel sounds are normal.  Musculoskeletal: Normal range of motion.       Right lower leg: Normal.       Left lower leg: Normal.    Neurological: He is alert and oriented to person, place, and time.  Skin: Skin is warm and dry. Capillary refill takes less than 2 seconds.  Psychiatric: He has a normal mood and affect. His behavior is normal.  Nursing note and vitals reviewed.    ED Treatments / Results  Labs (all labs ordered are listed, but only abnormal results are displayed) Labs Reviewed  BASIC METABOLIC PANEL - Abnormal; Notable for the following components:      Result Value   Glucose, Bld 108 (*)    All other components within normal limits  CBC  TROPONIN I    EKG EKG Interpretation  Date/Time:  Wednesday June 26 2018 08:21:13 EST Ventricular Rate:  46 PR Interval:    QRS Duration: 110 QT Interval:  452 QTC Calculation: 396 R Axis:   77 Text Interpretation:  Sinus bradycardia RSR' in V1 or V2, right VCD or RVH Since last tracing rate slower Confirmed by Jacalyn Lefevre [phone]) on 06/26/2018 8:47:16 AM   Radiology Dg Chest 2 View  Result Date: 06/26/2018 CLINICAL DATA:  47 year old male with 1 week of chest tightness and left face numbness. EXAM: CHEST - 2 VIEW COMPARISON:  CTA chest 04/25/2018 and earlier. FINDINGS: Lung volumes and mediastinal contours remain normal. Visualized tracheal air column is within normal limits. Both lungs appear clear. No pneumothorax or pleural effusion. No acute osseous abnormality identified. Negative visible bowel gas pattern. IMPRESSION: Negative.  No acute cardiopulmonary abnormality. Electronically Signed   By: Odessa Fleming M.D.   On: 06/26/2018 09:16   Ct Head Wo Contrast  Result Date: 06/26/2018 CLINICAL DATA:  47 year old with intermittent LEFT sided facial numbness over the past 2 weeks which typically resolves fairly quickly but which persisted today. EXAM: CT HEAD WITHOUT CONTRAST TECHNIQUE: Contiguous axial images were obtained from the base of the skull through the vertex without intravenous contrast. COMPARISON:  MRI brain 06/24/2007.  No prior CT head.  FINDINGS: Brain: Ventricular system normal in size and appearance for age. No mass lesion. No midline shift. No acute hemorrhage or hematoma. No extra-axial fluid collections. No evidence of acute infarction. No focal brain parenchymal abnormalities. Vascular: No visible aortoiliofemoral atherosclerosis. Widely patent visceral arteries. Normal-appearing portal venous and systemic venous systems. Skull: No skull fracture or other focal osseous abnormality involving the skull. Sinuses/Orbits: Visualized paranasal sinuses, bilateral mastoid air cells and bilateral middle ear cavities well-aerated. Visualized orbits and globes normal in appearance. Other: None. IMPRESSION: Normal examination. Electronically Signed   By: Hulan Saas M.D.   On: 06/26/2018 10:00   US Venous Img Lower Unilateral Left  Result Date: 06/26/2018 CLINICAL DATA:  47 year old with left leg pain and swelling. History of left lower  extremity DVT. Venous duplex on 04/26/2018 demonstrated acute DVT in the left popliteal vein and left peroneal vein with chronic DVT involving the left common femoral vein and left femoral vein. EXAM: LEFT LOWER EXTREMITY VENOUS DOPPLER ULTRASOUND TECHNIQUE: Gray-scale sonography with graded compression, as well as color Doppler and duplex ultrasound were performed to evaluate the lower extremity deep venous systems from the level of the common femoral vein and including the common femoral, femoral, profunda femoral, popliteal and calf veins including the posterior tibial, peroneal and gastrocnemius veins when visible. The superficial great saphenous vein was also interrogated. Spectral Doppler was utilized to evaluate flow at rest and with distal augmentation maneuvers in the common femoral, femoral and popliteal veins. COMPARISON:  Lower extremity venous duplex examination report from 04/26/2018. Images not available. FINDINGS: Contralateral Common Femoral Vein: Respiratory phasicity is normal and symmetric  with the symptomatic side. No evidence of thrombus. Normal compressibility. Common Femoral Vein: No evidence of thrombus. Normal compressibility, respiratory phasicity and response to augmentation. Saphenofemoral Junction: No evidence of thrombus. Normal compressibility and flow on color Doppler imaging. Profunda Femoral Vein: No evidence of thrombus. Normal compressibility and flow on color Doppler imaging. Femoral Vein: Positive for thrombus. Left femoral vein is not compressible with echogenic thrombus. Minimal flow in the left femoral vein. Thrombus extends throughout the left femoral vein. Popliteal Vein: Positive for thrombus. Left popliteal vein is not compressible with nonocclusive thrombus. Calf Veins: Visualized left deep calf veins are patent without thrombus. Peroneal veins are poorly visualized on this examination. Superficial Great Saphenous Vein: No evidence of thrombus. Normal compressibility. Venous Reflux:  None. Other Findings:  None. IMPRESSION: Positive for deep venous thrombosis involving the left femoral vein and left popliteal vein. Difficult to characterize the age of the DVT without the comparison images. Electronically Signed   By: Richarda Overlie M.D.   On: 06/26/2018 09:46   [place] 04/26/18    Final Interpretation: Right: There is no evidence of deep vein thrombosis in the lower extremity. No cystic structure found in the popliteal fossa. 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 verbal report called to his referring physician  Per Dr Dulce Sellar  04/26/18 Sinda Du  The written impressions seem to be similar.   Procedures Procedures (including critical care time)  Medications Ordered in ED Medications - No data to display   Initial Impression / Assessment and Plan / ED Course  I have reviewed the triage vital signs and the nursing  notes.  Pertinent labs & imaging results that were available during my care of the patient were reviewed by me and considered in my medical decision making (see chart for details).    Pt's work up negative so far for anything acute, but I think pt needs an MRI.    The ultrasound today seems to be similar to US done in the office on 10/4, although the radiologist can't compare the images directly.  He has an appt with vascular in January for a recheck of the DVT.  Sx have been intermittent for a week, so I don't think he's a code stroke.  Pt d/w Dr. Patria M[facility] ED) who accepts him for transfer.  Pt refuses EMS.  His wife will drive him.   Final Clinical Impressions(s) / ED Diagnoses   Final diagnoses:  Paresthesia  Chronic deep vein thrombosis (DVT) of femoral vein of left lower extremity (HCC)  Chronic pulmonary embolism without acute cor pulmonale, unspecified pulmonary embolism type [facility])    ED Discharge Orders    None       Jacalyn Lefevre, MD 06/26/18 1034

## 2018-06-26 NOTE — ED Triage Notes (Signed)
Pt states currently being treated for PE's and DVT's, states chronic chest pressure and facial tingling that last a few minutes but now its over ; denies diaphoresis or SOB

## 2018-06-26 NOTE — ED Notes (Signed)
ED Provider at bedside. 

## 2018-06-26 NOTE — ED Provider Notes (Signed)
Patient denies weakness of his arms or legs.  He reports intermittent a.m. left-sided jaw and facial numbness with some left-sided symptoms.  Today he presented to the ER with chest discomfort as well.  [facility] for possible need for MRI to evaluate for stroke.  On arrival to the emergency department the patient reports that he had no weakness of his arms or legs.  He was most concerned regarding his chest discomfort with associated neck discomfort.  He has a family history of early cardiac disease.  He denies shortness of breath.  He has a history of known DVT with persistent and worsening swelling in his left lower extremity over the past several days.  There is no pleuritic chest pain.  At this time we will not convinced the patient has a stroke.  I do not think he needs an MRI of his brain.  He has progressive swelling in his left lower extremity had chest pain this morning.  Patient will be evaluated for anticoagulation treatment failure with CT angiogram to evaluate for pulmonary embolism.  CTA is negative for PE.  I do not think he needs an MRI of his brain at this time.  She is agreeable to outpatient plan.  Close primary care follow-up.  He will follow-up back with his vascular and vein specialist regarding his worsening left lower extremity swelling as well as his cardiologist in regards to his chest discomfort.  Patient is encouraged to return to the emergency department for new or worsening symptoms   Azalia Bilisampos, Haleem Hanner, MD 06/26/18 1429

## 2018-06-26 NOTE — ED Notes (Signed)
Patient transported to CT 

## 2018-06-27 ENCOUNTER — Ambulatory Visit (INDEPENDENT_AMBULATORY_CARE_PROVIDER_SITE_OTHER): Payer: 59 | Admitting: Cardiology

## 2018-06-27 ENCOUNTER — Encounter: Payer: Self-pay | Admitting: Cardiology

## 2018-06-27 VITALS — BP 116/72 | HR 57 | Ht 70.0 in | Wt 194.0 lb

## 2018-06-27 DIAGNOSIS — I4891 Unspecified atrial fibrillation: Secondary | ICD-10-CM | POA: Diagnosis not present

## 2018-06-27 DIAGNOSIS — D6869 Other thrombophilia: Secondary | ICD-10-CM

## 2018-06-27 DIAGNOSIS — I209 Angina pectoris, unspecified: Secondary | ICD-10-CM

## 2018-06-27 HISTORY — DX: Angina pectoris, unspecified: I20.9

## 2018-06-27 MED ORDER — NITROGLYCERIN 0.4 MG SL SUBL: 0.4000 mg | SUBLINGUAL_TABLET | SUBLINGUAL | 11 refills | Status: DC | PRN

## 2018-06-27 MED ORDER — METOPROLOL TARTRATE 50 MG PO TABS: 50.0000 mg | ORAL_TABLET | Freq: Two times a day (BID) | ORAL | 0 refills | Status: DC

## 2018-06-27 NOTE — Progress Notes (Signed)
Cardiology Office Note:    Date:  06/27/2018   ID:  Jonathan Archer, DOB 12/17/1970, MRN [id number]  PCP:  Ronnald Nian, MD  Cardiologist:  Garwin Brothers, MD   Referring MD: Ronnald Nian, MD    ASSESSMENT:    1. Angina pectoris (HCC)   2. Hypercoagulability due to atrial fibrillation (HCC)    PLAN:    In order of problems listed above:  1. Primary prevention stressed with the patient.  Importance of compliance with diet and medication stressed and he vocalized understanding.  His chest pain symptoms are atypical.  He specifically told me that during sexual activity he has no symptoms like this. 2. Sublingual nitroglycerin prescription was sent, its protocol and 911 protocol explained and the patient vocalized understanding questions were answered to the patient's satisfaction 3. He has had a stress test which was unremarkable recently.  But because of his significant NSAID we will schedule him for a CT coronary angiography.  Further recommendations will be made based on the findings of that test.   Medication Adjustments/Labs and Tests Ordered: Current medicines are reviewed at length with the patient today.  Concerns regarding medicines are outlined above.  Orders Placed This Encounter  Procedures  . CT CORONARY MORPH W/CTA COR W/SCORE W/CA W/CM &/OR WO/CM  . CT CORONARY FRACTIONAL FLOW RESERVE DATA PREP  . CT CORONARY FRACTIONAL FLOW RESERVE FLUID ANALYSIS   Meds ordered this encounter  Medications  . nitroGLYCERIN (NITROSTAT) 0.4 MG SL tablet    Sig: Place 1 tablet (0.4 mg total) under the tongue every 5 (five) minutes as needed.    Dispense:  25 tablet    Refill:  11  . metoprolol tartrate (LOPRESSOR) 50 MG tablet    Sig: Take 1 tablet (50 mg total) by mouth 2 (two) times daily for 2 doses.    Dispense:  2 tablet    Refill:  0     No chief complaint on file.    History of Present Illness:    Jonathan Archer is a 47 y.o. male.  Patient was seen by me a couple  of days ago.  He mentions to me that he had an episode of chest discomfort and he is left side of the face specifically not the jaw had a tingling sensation.  This concerned him and he went to the emergency room and was evaluated and treated and discharged.  CT scan ruled out pulmonary embolism and his DVT study was done which did not show any remarkable changes from the previous study.  Currently patient is comfortable and is here for follow-up.  At the time of my evaluation, the patient is alert awake oriented and in no distress.  Past Medical History:  Diagnosis Date  . DVT (deep venous thrombosis) (HCC)   . ED (erectile dysfunction)   . History of pulmonary embolus (PE)   . Hypogonadism male     Past Surgical History:  Procedure Laterality Date  . MENISECTOMY  2006    Current Medications: Current Meds  Medication Sig  . albuterol (PROVENTIL HFA;VENTOLIN HFA) 108 (90 Base) MCG/ACT inhaler Inhale 2 puffs into the lungs every 6 (six) hours as needed for wheezing or shortness of breath.  Marland Kitchen apixaban (ELIQUIS) 5 MG TABS tablet Take 1 tablet (5 mg total) by mouth 2 (two) times daily.  . Menthol, Topical Analgesic, (BIOFREEZE EX) Apply 1 application topically as needed (leg pain).     Allergies:   Vicodin [hydrocodone-acetaminophen]  Social History   Socioeconomic History  . Marital status: Single    Spouse name: Not on file  . Number of children: Not on file  . Years of education: Not on file  . Highest education level: Not on file  Occupational History  . Not on file  Social Needs  . Financial resource strain: Not on file  . Food insecurity:    Worry: Not on file    Inability: Not on file  . Transportation needs:    Medical: Not on file    Non-medical: Not on file  Tobacco Use  . Smoking status: Never Smoker  . Smokeless tobacco: Never Used  Substance and Sexual Activity  . Alcohol use: Yes    Comment: rarely  . Drug use: Yes    Types: Marijuana  . Sexual activity:  Not on file  Lifestyle  . Physical activity:    Days per week: Not on file    Minutes per session: Not on file  . Stress: Not on file  Relationships  . Social connections:    Talks on phone: Not on file    Gets together: Not on file    Attends religious service: Not on file    Active member of club or organization: Not on file    Attends meetings of clubs or organizations: Not on file    Relationship status: Not on file  Other Topics Concern  . Not on file  Social History Narrative  . Not on file     Family History: The patient's family history includes Heart attack in his father, mother, and sister; Heart disease in his father, mother, and sister.  ROS:   Please see the history of present illness.    All other systems reviewed and are negative.  EKGs/Labs/Other Studies Reviewed:    The following studies were reviewed today: I reviewed emergency room records and discussed with the patient at length.   Recent Labs: 01/07/2018: ALT 29; BNP 91.1 06/26/2018: BUN 11; Creatinine, Ser 1.24; Hemoglobin 15.2; Platelets 219; Potassium 4.0; Sodium 135  Recent Lipid Panel    Component Value Date/Time   CHOL 196 01/07/2018 1453   TRIG 123 01/07/2018 1453   HDL 48 01/07/2018 1453   CHOLHDL 4.1 01/07/2018 1453   CHOLHDL 3.6 01/12/2015 0001   VLDL 13 01/12/2015 0001   LDLCALC 123 (H) 01/07/2018 1453    Physical Exam:    VS:  BP 116/72 (BP Location: Right Arm, Patient Position: Sitting, Cuff Size: Normal)   Pulse (!) 57   Ht 5\' 10"  (1.778 m)   Wt 194 lb (88 kg)   SpO2 99%   BMI 27.84 kg/m     Wt Readings from Last 3 Encounters:  06/27/18 194 lb (88 kg)  06/24/18 200 lb (90.7 kg)  05/16/18 197 lb (89.4 kg)     GEN: Patient is in no acute distress HEENT: Normal NECK: No JVD; No carotid bruits LYMPHATICS: No lymphadenopathy CARDIAC: Hear sounds regular, 2/6 systolic murmur at the apex. RESPIRATORY:  Clear to auscultation without rales, wheezing or rhonchi  ABDOMEN:  Soft, non-tender, non-distended MUSCULOSKELETAL:  No edema; No deformity  SKIN: Warm and dry NEUROLOGIC:  Alert and oriented x 3 PSYCHIATRIC:  Normal affect   Signed, Garwin Brothersajan R Revankar, MD  06/27/2018 12:18 PM    [facility]

## 2018-06-27 NOTE — Patient Instructions (Addendum)
Medication Instructions:  Your physician has recommended you make the following change in your medication:   Take metoprolol 2 hours before testing  START Nitroglycerin 0.4 mg sublingual (under your tongue) as needed for chest pain. If experiencing chest pain, stop what you are doing and sit down. Take 1 nitroglycerin and wait 5 minutes. If chest pain continues, take another nitroglycerin and wait 5 minutes. If chest pain does not subside, take 1 more nitroglycerin and dial 911. You make take a total of 3 nitroglycerin in a 15 minute time frame.  If you need a refill on your cardiac medications before your next appointment, please call your pharmacy.   Lab work: None  If you have labs (blood work) drawn today and your tests are completely normal, you will receive your results only by: Marland Kitchen MyChart Message (if you have MyChart) OR . A paper copy in the mail If you have any lab test that is abnormal or we need to change your treatment, we will call you to review the results.  Testing/Procedures: Please arrive at the [facility] main entrance of [facility] at xx:xx AM (30-45 minutes prior to test start time)  [facility] [address] [place], [place] [address] [phone]  Proceed to the [facility] Radiology Department (First Floor).  Please follow these instructions carefully (unless otherwise directed):  Hold all erectile dysfunction medications at least 48 hours prior to test.  On the Night Before the Test: . Be sure to Drink plenty of water. . Do not consume any caffeinated/decaffeinated beverages or chocolate 12 hours prior to your test. . Do not take any antihistamines 12 hours prior to your test.  On the Day of the Test: . Drink plenty of water. Do not drink any water within one hour of the test. . Do not eat any food 4 hours prior to the test. . You may take your regular medications prior to the test.  . Take metoprolol (Lopressor) two hours prior  to test. . HOLD Furosemide/Hydrochlorothiazide morning of the test.         -Take metoprolol (Lopressor) 2 hours prior to test (if applicable).                  -If HR is less than 55 beats per minute- No metoprolol                 -IF HR is greater than 55 beats per minute- metoprolol may be taken.        After the Test: . Drink plenty of water. . After receiving IV contrast, you may experience a mild flushed feeling. This is normal. . On occasion, you may experience a mild rash up to 24 hours after the test. This is not dangerous. If this occurs, you can take Benadryl 25 mg and increase your fluid intake. . If you experience trouble breathing, this can be serious. If it is severe call 911 IMMEDIATELY. If it is mild, please call our office.   Follow-Up: At [facility], you and your health needs are our priority.  As part of our continuing mission to provide you with exceptional heart care, we have created designated Provider Care Teams.  These Care Teams include your primary Cardiologist (physician) and Advanced Practice Providers (APPs -  Physician Assistants and Nurse Practitioners) who all work together to provide you with the care you need, when you need it.  You will need a follow up appointment in 6 months.  Please call our office 2 months in advance to schedule this appointment.  You may see another member of our [facility] Provider Team in [place]: Gypsy Balsamobert Krasowski, MD . Norman HerrlichBrian Munley, MD  Any Other Special Instructions Will Be Listed Below (If Applicable).   Cardiac CT Angiogram A cardiac CT angiogram is a procedure to look at the heart and the area around the heart. It may be done to help find the cause of chest pains or other symptoms of heart disease. During this procedure, a large X-ray machine, called a CT scanner, takes detailed pictures of the heart and the surrounding area after a dye (contrast material) has been injected into blood vessels in the area. The  procedure is also sometimes called a coronary CT angiogram, coronary artery scanning, or CTA. A cardiac CT angiogram allows the health care provider to see how well blood is flowing to and from the heart. The health care provider will be able to see if there are any problems, such as:  Blockage or narrowing of the coronary arteries in the heart.  Fluid around the heart.  Signs of weakness or disease in the muscles, valves, and tissues of the heart.  Tell a health care provider about:  Any allergies you have. This is especially important if you have had a previous allergic reaction to contrast dye.  All medicines you are taking, including vitamins, herbs, eye drops, creams, and over-the-counter medicines.  Any blood disorders you have.  Any surgeries you have had.  Any medical conditions you have.  Whether you are pregnant or may be pregnant.  Any anxiety disorders, chronic pain, or other conditions you have that may increase your stress or prevent you from lying still. What are the risks? Generally, this is a safe procedure. However, problems may occur, including:  Bleeding.  Infection.  Allergic reactions to medicines or dyes.  Damage to other structures or organs.  Kidney damage from the dye or contrast that is used.  Increased risk of cancer from radiation exposure. This risk is low. Talk with your health care provider about: ? The risks and benefits of testing. ? How you can receive the lowest dose of radiation.  What happens before the procedure?  Wear comfortable clothing and remove any jewelry, glasses, dentures, and hearing aids.  Follow instructions from your health care provider about eating and drinking. This may include: ? For 12 hours before the test - avoid caffeine. This includes tea, coffee, soda, energy drinks, and diet pills. Drink plenty of water or other fluids that do not have caffeine in them. Being well-hydrated can prevent complications. ? For  4-6 hours before the test - stop eating and drinking. The contrast dye can cause nausea, but this is less likely if your stomach is empty.  Ask your health care provider about changing or stopping your regular medicines. This is especially important if you are taking diabetes medicines, blood thinners, or medicines to treat erectile dysfunction. What happens during the procedure?  Hair on your chest may need to be removed so that small sticky patches called electrodes can be placed on your chest. These will transmit information that helps to monitor your heart during the test.  An IV tube will be inserted into one of your veins.  You might be given a medicine to control your heart rate during the test. This will help to ensure that good images are obtained.  You will be asked to lie on an exam table. This table  will slide in and out of the CT machine during the procedure.  Contrast dye will be injected into the IV tube. You might feel warm, or you may get a metallic taste in your mouth.  You will be given a medicine (nitroglycerin) to relax (dilate) the arteries in your heart.  The table that you are lying on will move into the CT machine tunnel for the scan.  The person running the machine will give you instructions while the scans are being done. You may be asked to: ? Keep your arms above your head. ? Hold your breath. ? Stay very still, even if the table is moving.  When the scanning is complete, you will be moved out of the machine.  The IV tube will be removed. The procedure may vary among health care providers and hospitals. What happens after the procedure?  You might feel warm, or you may get a metallic taste in your mouth from the contrast dye.  You may have a headache from the nitroglycerin.  After the procedure, drink water or other fluids to wash (flush) the contrast material out of your body.  Contact a health care provider if you have any symptoms of allergy to the  contrast. These symptoms include: ? Shortness of breath. ? Rash or hives. ? A racing heartbeat.  Most people can return to their normal activities right after the procedure. Ask your health care provider what activities are safe for you.  It is up to you to get the results of your procedure. Ask your health care provider, or the department that is doing the procedure, when your results will be ready. Summary  A cardiac CT angiogram is a procedure to look at the heart and the area around the heart. It may be done to help find the cause of chest pains or other symptoms of heart disease.  During this procedure, a large X-ray machine, called a CT scanner, takes detailed pictures of the heart and the surrounding area after a dye (contrast material) has been injected into blood vessels in the area.  Ask your health care provider about changing or stopping your regular medicines before the procedure. This is especially important if you are taking diabetes medicines, blood thinners, or medicines to treat erectile dysfunction.  After the procedure, drink water or other fluids to wash (flush) the contrast material out of your body. This information is not intended to replace advice given to you by your health care provider. Make sure you discuss any questions you have with your health care provider. Document Released: 06/22/2008 Document Revised: 05/29/2016 Document Reviewed: 05/29/2016 Elsevier Interactive Patient Education  2017 [employer].

## 2018-07-10 ENCOUNTER — Ambulatory Visit (HOSPITAL_COMMUNITY)
Admission: RE | Admit: 2018-07-10 | Discharge: 2018-07-10 | Disposition: A | Discharge status: Home / Self Care | Payer: 59 | Source: Ambulatory Visit | Attending: Vascular Surgery | Admitting: Vascular Surgery

## 2018-07-10 ENCOUNTER — Ambulatory Visit (INDEPENDENT_AMBULATORY_CARE_PROVIDER_SITE_OTHER): Payer: 59 | Admitting: Vascular Surgery

## 2018-07-10 ENCOUNTER — Encounter: Payer: Self-pay | Admitting: Vascular Surgery

## 2018-07-10 VITALS — BP 131/86 | HR 52 | Temp 98.0°F | Resp 16 | Ht 70.0 in | Wt 200.0 lb

## 2018-07-10 DIAGNOSIS — I82412 Acute embolism and thrombosis of left femoral vein: Secondary | ICD-10-CM | POA: Insufficient documentation

## 2018-07-10 NOTE — Progress Notes (Signed)
Patient name: Jonathan Archer MRN: [id number] DOB: [birth year] Sex: male  REASON FOR VISIT:   Follow-up of DVT.  HPI:   Jonathan Archer is a pleasant 47 y.o. male who was seen in consultation on 04/26/2018 with a left lower extremity DVT and small pulmonary embolus.  This patient had a DVT that extends from the tibial veins to the common femoral vein.  The patient did not have significant lower extremity swelling.  It was difficult to determine how long the clot had been present based on the duplex and based on his history.  Given that it was not clear when he developed a clot and he had minimal leg swelling I did not think thrombolysis was indicated.  I felt that a 73-month course of oral anticoagulation and aggressive leg elevation was indicated.  He comes in for 42-month follow-up visit.  The patient continues to have some pain in his left leg especially when he is driving or standing for long period of time.  Unfortunately he has to do a fair amount of this on his job.  He gets paint estimates.  The symptoms are relieved somewhat with elevation.  He has been wearing compression stockings but it does not sound like these are fitted compression stockings.  His main concern today is that he is having some paresthesias along his left face and into his left arm.  He was seen in the emergency department and worked up and this was unremarkable.  He does also describe some neck pain.  Current Outpatient Medications  Medication Sig Dispense Refill  . albuterol (PROVENTIL HFA;VENTOLIN HFA) 108 (90 Base) MCG/ACT inhaler Inhale 2 puffs into the lungs every 6 (six) hours as needed for wheezing or shortness of breath. 1 Inhaler 0  . apixaban (ELIQUIS) 5 MG TABS tablet Take 1 tablet (5 mg total) by mouth 2 (two) times daily. 180 tablet 2  . Menthol, Topical Analgesic, (BIOFREEZE EX) Apply 1 application topically as needed (leg pain).    . metoprolol tartrate (LOPRESSOR) 50 MG tablet Take 1 tablet (50 mg total) by  mouth 2 (two) times daily for 2 doses. 2 tablet 0  . nitroGLYCERIN (NITROSTAT) 0.4 MG SL tablet Place 1 tablet (0.4 mg total) under the tongue every 5 (five) minutes as needed. 25 tablet 11   No current facility-administered medications for this visit.     REVIEW OF SYSTEMS:  [X]  denotes positive finding, [ ]  denotes negative finding Vascular    Leg swelling    Cardiac    Chest pain or chest pressure:    Shortness of breath upon exertion:    Short of breath when lying flat:    Irregular heart rhythm:    Constitutional    Fever or chills:     PHYSICAL EXAM:   Vitals:   07/10/18 1155  BP: 131/86  Pulse: (!) 52  Resp: 16  Temp: 98 F (36.7 C)  SpO2: 99%  Weight: 200 lb (90.7 kg)  Height: 5\' 10"  (1.778 m)    GENERAL: The patient is a well-nourished male, in no acute distress. The vital signs are documented above. CARDIOVASCULAR: There is a regular rate and rhythm. PULMONARY: There is good air exchange bilaterally without wheezing or rales. VASCULAR: I do not detect carotid bruits. He has palpable pedal pulses. Currently he does not have any significant lower extremity swelling.  DATA:   VENOUS DUPLEX: I have independently interpreted his venous duplex scan today.  The patient has partial recanalization of  the left femoral vein in the mid and distal segment and also the popliteal vein.  MEDICAL ISSUES:   LEFT LOWER EXTREMITY DVT: This patient has been on Eliquis for 3 months.  His duplex scan today shows some recanalization of his femoral vein.  For this reason I would recommend continuing Eliquis for another 3 months.  In addition we have discussed the importance of intermittent leg elevation and I think this will also help with resolution of the clot.  I have also written him for a knee-high compression stocking with a gradient of 20 to 30 mmHg as I think this will help his symptoms when he standing and traveling.  I will plan on seeing him back in 3 months with a follow-up  duplex scan.  I have recommended that he continue his Eliquis for another 3 months.  He will call sooner if he has problems.  NECK PAIN WITH PARESTHESIAS IN THE LEFT FACE AND LEFT ARM:  We will refer him to neurology for further evaluation of this.  A total of 35 minutes was spent on this visit. 20 minutes was face to face time. More than 50% of the time was spent on counseling and coordinating with the patient.    Waverly Ferrarihristopher Aadin Gaut [facility] [phone]

## 2018-07-15 ENCOUNTER — Telehealth: Payer: Self-pay | Admitting: Neurology

## 2018-07-15 ENCOUNTER — Ambulatory Visit (INDEPENDENT_AMBULATORY_CARE_PROVIDER_SITE_OTHER): Payer: 59 | Admitting: Neurology

## 2018-07-15 ENCOUNTER — Encounter: Payer: Self-pay | Admitting: Neurology

## 2018-07-15 VITALS — BP 128/94 | HR 56 | Ht 70.0 in | Wt 198.5 lb

## 2018-07-15 DIAGNOSIS — R202 Paresthesia of skin: Secondary | ICD-10-CM

## 2018-07-15 HISTORY — DX: Paresthesia of skin: R20.2

## 2018-07-15 NOTE — Progress Notes (Signed)
PATIENT: Jonathan Archer DOB: 12/17/1970  Chief Complaint  Patient presents with  . Numbness    Reports left-sided numbness/tingling on left side of face and arm.  His symptoms intensified with physical activity.   Marland Kitchen. PCP    [name], [name], MD ( referred from ED)     HISTORICAL  Jonathan Archer is a 47 year old male, seen in request by his primary care doctor [name], [name] for evaluation of numbness, initial evaluation was on July 15, 2018.  He was previously healthy, suffered left knee injury on June 23, 2017 playing soccer, he has strong family history of coronary artery disease, in preparation for potential left knee surgery in October 2019, during presurgical medical work-up, he was found to have left lower extremity DVT, CT chest also showed PE, he was sent to the emergency room on April 26, 2018, started Eliquis 5 mg twice a day since then  He noted intermittent left facial numbness since October 2019, often wake up with transient left facial numbness extending to his left neck, left shoulder, lasting 10 to 15 minutes, but on June 26, 2018, he had persistent left facial numbness, increased left facial numbness with exertion,  Personally reviewed CT head on June 26, 2016 that was normal.  Doppler study of bilateral lower extremity Positive for deep venous thrombosis involving the left femoral vein and left popliteal vein.  . Findings consistent with chronic deep vein thrombosis involving the left femoral vein, and left popliteal vein. No cystic structure found in the popliteal fossa.   CT angiogram of the chest June 26, 2018 showed no acute pulmonary emboli and, no evidence of residual pulmonary emboli Laboratory evaluations showed negative troponin, normal CBC, BMP with exception of mild elevated glucose 108 REVIEW OF SYSTEMS: Full 14 system review of systems performed and notable only for fatigue, chest pain, palpitation, sweating legs, shortness of breath,  headache, numbness, sleepiness, dizziness All other review of systems were negative.  ALLERGIES: Allergies  Allergen Reactions  . Vicodin [Hydrocodone-Acetaminophen] Other (See Comments)    Eyes burn    HOME MEDICATIONS: Current Outpatient Medications  Medication Sig Dispense Refill  . albuterol (PROVENTIL HFA;VENTOLIN HFA) 108 (90 Base) MCG/ACT inhaler Inhale 2 puffs into the lungs every 6 (six) hours as needed for wheezing or shortness of breath. 1 Inhaler 0  . apixaban (ELIQUIS) 5 MG TABS tablet Take 1 tablet (5 mg total) by mouth 2 (two) times daily. 180 tablet 2  . Menthol, Topical Analgesic, (BIOFREEZE EX) Apply 1 application topically as needed (leg pain).    . nitroGLYCERIN (NITROSTAT) 0.4 MG SL tablet Place 1 tablet (0.4 mg total) under the tongue every 5 (five) minutes as needed. 25 tablet 11  . metoprolol tartrate (LOPRESSOR) 50 MG tablet Take 1 tablet (50 mg total) by mouth 2 (two) times daily for 2 doses. 2 tablet 0   No current facility-administered medications for this visit.     PAST MEDICAL HISTORY: Past Medical History:  Diagnosis Date  . DVT (deep venous thrombosis) (HCC)   . ED (erectile dysfunction)   . History of pulmonary embolus (PE)   . Hypogonadism male   . Paresthesia     PAST SURGICAL HISTORY: Past Surgical History:  Procedure Laterality Date  . MENISECTOMY  2006    FAMILY HISTORY: Family History  Problem Relation Age of Onset  . Heart disease Sister   . Heart attack Sister   . Heart disease Mother   . Heart attack Mother   .  Heart disease Father   . Heart attack Father     SOCIAL HISTORY: Social History   Socioeconomic History  . Marital status: Single    Spouse name: Not on file  . Number of children: 3  . Years of education: college  . Highest education level: Not on file  Occupational History  . Occupation: Chief Strategy Officer  . Financial resource strain: Not on file  . Food insecurity:    Worry: Not on file     Inability: Not on file  . Transportation needs:    Medical: Not on file    Non-medical: Not on file  Tobacco Use  . Smoking status: Never Smoker  . Smokeless tobacco: Never Used  Substance and Sexual Activity  . Alcohol use: Yes    Comment: rarely  . Drug use: Yes    Types: Marijuana    Comment: small amount daily  . Sexual activity: Not on file  Lifestyle  . Physical activity:    Days per week: Not on file    Minutes per session: Not on file  . Stress: Not on file  Relationships  . Social connections:    Talks on phone: Not on file    Gets together: Not on file    Attends religious service: Not on file    Active member of club or organization: Not on file    Attends meetings of clubs or organizations: Not on file    Relationship status: Not on file  . Intimate partner violence:    Fear of current or ex partner: Not on file    Emotionally abused: Not on file    Physically abused: Not on file    Forced sexual activity: Not on file  Other Topics Concern  . Not on file  Social History Narrative   Lives at home with his son.   Left-sided.   1-2 cups of Pepsi per day.     PHYSICAL EXAM   Vitals:   07/15/18 1420  BP: (!) 128/94  Pulse: (!) 56  Weight: 198 lb 8 oz (90 kg)  Height: 5\' 10"  (1.778 m)    Not recorded      Body mass index is 28.48 kg/m.  PHYSICAL EXAMNIATION:  Gen: NAD, conversant, well nourised, obese, well groomed                     Cardiovascular: Regular rate rhythm, no peripheral edema, warm, nontender. Eyes: Conjunctivae clear without exudates or hemorrhage Neck: Supple, no carotid bruits. Pulmonary: Clear to auscultation bilaterally   NEUROLOGICAL EXAM:  MENTAL STATUS: Speech:    Speech is normal; fluent and spontaneous with normal comprehension.  Cognition:     Orientation to time, place and person     Normal recent and remote memory     Normal Attention span and concentration     Normal Language, naming, repeating,spontaneous  speech     Fund of knowledge   CRANIAL NERVES: CN II: Visual fields are full to confrontation. Fundoscopic exam is normal with sharp discs and no vascular changes. Pupils are round equal and briskly reactive to light. CN III, IV, VI: extraocular movement are normal. No ptosis. CN V: Facial sensation is intact to pinprick in all 3 divisions bilaterally. Corneal responses are intact.  CN VII: Face is symmetric with normal eye closure and smile. CN VIII: Hearing is normal to rubbing fingers CN IX, X: Palate elevates symmetrically. Phonation is normal. CN XI: Head turning and  shoulder shrug are intact CN XII: Tongue is midline with normal movements and no atrophy.  MOTOR: There is no pronator drift of out-stretched arms. Muscle bulk and tone are normal. Muscle strength is normal.  REFLEXES: Reflexes are 2+ and symmetric at the biceps, triceps, knees, and ankles. Plantar responses are flexor.  SENSORY: Intact to light touch, pinprick, positional sensation and vibratory sensation are intact in fingers and toes.  COORDINATION: Rapid alternating movements and fine finger movements are intact. There is no dysmetria on finger-to-nose and heel-knee-shin.    GAIT/STANCE: Posture is normal. Gait is steady with normal steps, base, arm swing, and turning. Heel and toe walking are normal. Tandem gait is normal.  Romberg is absent.   DIAGNOSTIC DATA (LABS, IMAGING, TESTING) - I reviewed patient records, labs, notes, testing and imaging myself where available.   ASSESSMENT AND PLAN  Jonathan Archer is a 47 y.o. male   Left facial paresthesia   complete eval lesion with MRI of brain to rule out right thalamic lesions  Continue Eliquis 5 mg twice daily was started since April 26, 2018 for lower extremity DVT  More cardiac evaluation is pending,  If necessary may consider echocardiogram to rule out AFO      Levert FeinsteinYijun Eliora Nienhuis, M.D. Ph.D.  [facility] [address], [address] [place], [place] [address] Ph: [phone] Fax: [phone]  CC: [name], [name], MD

## 2018-07-15 NOTE — Telephone Encounter (Signed)
[employer] order sent to GI. They obtain the auth and they will reach out to the pt to schedule.  °

## 2018-07-29 ENCOUNTER — Ambulatory Visit
Admission: RE | Admit: 2018-07-29 | Discharge: 2018-07-29 | Disposition: A | Discharge status: Home / Self Care | Payer: 59 | Source: Ambulatory Visit | Attending: Neurology | Admitting: Neurology

## 2018-07-29 DIAGNOSIS — R202 Paresthesia of skin: Secondary | ICD-10-CM

## 2018-07-30 ENCOUNTER — Telehealth: Payer: Self-pay | Admitting: Neurology

## 2018-07-30 NOTE — Telephone Encounter (Signed)
  I called the patient.  MRI of the brain was normal.  No explanation for facial numbness.    MRI brain 07/29/18:  IMPRESSION: This is a normal noncontrasted MRI of the brain

## 2018-07-31 ENCOUNTER — Ambulatory Visit: Payer: 59 | Admitting: Vascular Surgery

## 2018-07-31 ENCOUNTER — Encounter (HOSPITAL_COMMUNITY): Payer: 59

## 2018-07-31 ENCOUNTER — Telehealth (HOSPITAL_COMMUNITY): Payer: Self-pay | Admitting: Emergency Medicine

## 2018-07-31 NOTE — Telephone Encounter (Signed)
Left message on voicemail with name and callback number Cai Flott RN Navigator Cardiac Imaging [phone] 

## 2018-07-31 NOTE — Telephone Encounter (Signed)
Reaching out to patient to offer assistance regarding upcoming cardiac imaging study; pt verbalizes understanding of appt date/time, parking situation and where to check in, pre-test NPO status and medications ordered, and verified current allergies; name and call back number provided for further questions should they arise Alianna Wurster RN Navigator Cardiac Imaging [phone] 

## 2018-08-02 ENCOUNTER — Ambulatory Visit (HOSPITAL_COMMUNITY)
Admission: RE | Admit: 2018-08-02 | Discharge: 2018-08-02 | Disposition: A | Discharge status: Home / Self Care | Payer: 59 | Source: Ambulatory Visit | Attending: Cardiology | Admitting: Cardiology

## 2018-08-02 DIAGNOSIS — I209 Angina pectoris, unspecified: Secondary | ICD-10-CM

## 2018-08-02 MED ORDER — NITROGLYCERIN 0.4 MG SL SUBL
0.8000 mg | SUBLINGUAL_TABLET | Freq: Once | SUBLINGUAL | Status: AC
  Administered 2018-08-02: 0.8 mg via SUBLINGUAL

## 2018-08-02 MED ORDER — NITROGLYCERIN 0.4 MG SL SUBL
SUBLINGUAL_TABLET | SUBLINGUAL | Status: AC
  Filled 2018-08-02: qty 2

## 2018-08-02 MED ORDER — IOPAMIDOL (ISOVUE-370) INJECTION 76%
80.0000 mL | Freq: Once | INTRAVENOUS | Status: AC | PRN
  Administered 2018-08-02: 80 mL via INTRAVENOUS

## 2018-08-02 NOTE — Progress Notes (Signed)
Patient ambulatory out of department with steady gait noted. Denies any complaints.  

## 2018-08-02 NOTE — Progress Notes (Signed)
CT complete. Patient denies any complaints. Patient givven water as requested.

## 2018-09-16 ENCOUNTER — Ambulatory Visit: Payer: 59 | Admitting: Neurology

## 2018-10-16 ENCOUNTER — Other Ambulatory Visit: Payer: Self-pay

## 2018-10-16 ENCOUNTER — Ambulatory Visit (HOSPITAL_COMMUNITY)
Admission: RE | Admit: 2018-10-16 | Discharge: 2018-10-16 | Disposition: A | Discharge status: Home / Self Care | Payer: 59 | Source: Ambulatory Visit | Attending: Family | Admitting: Family

## 2018-10-16 ENCOUNTER — Encounter: Payer: Self-pay | Admitting: Vascular Surgery

## 2018-10-16 ENCOUNTER — Inpatient Hospital Stay (HOSPITAL_COMMUNITY): Admission: RE | Admit: 2018-10-16 | Payer: 59 | Source: Ambulatory Visit

## 2018-10-16 ENCOUNTER — Ambulatory Visit (INDEPENDENT_AMBULATORY_CARE_PROVIDER_SITE_OTHER): Payer: 59 | Admitting: Vascular Surgery

## 2018-10-16 ENCOUNTER — Ambulatory Visit: Payer: 59 | Admitting: Vascular Surgery

## 2018-10-16 VITALS — BP 139/94 | HR 60 | Temp 98.5°F | Resp 20 | Ht 70.0 in | Wt 192.0 lb

## 2018-10-16 DIAGNOSIS — I82412 Acute embolism and thrombosis of left femoral vein: Secondary | ICD-10-CM

## 2018-10-16 NOTE — Progress Notes (Signed)
Patient name: Jonathan Archer MRN: [id number] DOB: 1971-04-16 Sex: male  REASON FOR VISIT:   Follow-up after DVT.  HPI:   QAASIM KELLIS is a pleasant 48 y.o. male who I saw in consultation in October 2019 with a left lower extremity DVT and a small pulmonary embolus.  The clot extended from the tibial veins to the common femoral vein.  Patient did not have significant lower extremity swelling.  It was difficult to determine how long the clot had been present based on his history.  Given that it was not clear how long the clot had been present and he had minimal leg swelling I did not think thrombolysis was indicated.  He has been on Eliquis and comes in for a routine follow-up visit.  When I saw him last on 07/10/2018 he had partial recanalization of the left femoral vein and popliteal vein.  I recommended another 3 months of Eliquis and a follow-up duplex scan.  The patient has no specific complaints.  He said no significant leg swelling on either side.  He stays very active.  He just recently got a dog so he is out walking the dog quite a bit.  He has had no real aching pain or heaviness with standing.  He is unaware of any hypercoagulable condition in his family.  Past Medical History:  Diagnosis Date  . DVT (deep venous thrombosis) (HCC)   . ED (erectile dysfunction)   . History of pulmonary embolus (PE)   . Hypogonadism male   . Paresthesia     Family History  Problem Relation Age of Onset  . Heart disease Sister   . Heart attack Sister   . Heart disease Mother   . Heart attack Mother   . Heart disease Father   . Heart attack Father     SOCIAL HISTORY: Social History   Tobacco Use  . Smoking status: Never Smoker  . Smokeless tobacco: Never Used  Substance Use Topics  . Alcohol use: Yes    Comment: rarely    Allergies  Allergen Reactions  . Vicodin [Hydrocodone-Acetaminophen] Other (See Comments)    Eyes burn    Current Outpatient Medications  Medication Sig  Dispense Refill  . albuterol (PROVENTIL HFA;VENTOLIN HFA) 108 (90 Base) MCG/ACT inhaler Inhale 2 puffs into the lungs every 6 (six) hours as needed for wheezing or shortness of breath. 1 Inhaler 0  . apixaban (ELIQUIS) 5 MG TABS tablet Take 1 tablet (5 mg total) by mouth 2 (two) times daily. 180 tablet 2  . Menthol, Topical Analgesic, (BIOFREEZE EX) Apply 1 application topically as needed (leg pain).    . nitroGLYCERIN (NITROSTAT) 0.4 MG SL tablet Place 1 tablet (0.4 mg total) under the tongue every 5 (five) minutes as needed. (Patient not taking: Reported on 08/02/2018) 25 tablet 11   No current facility-administered medications for this visit.     REVIEW OF SYSTEMS:  [X]  denotes positive finding, [ ]  denotes negative finding Cardiac  Comments:  Chest pain or chest pressure:    Shortness of breath upon exertion:    Short of breath when lying flat:    Irregular heart rhythm:        Vascular    Pain in calf, thigh, or hip brought on by ambulation:    Pain in feet at night that wakes you up from your sleep:     Blood clot in your veins:    Leg swelling:  Pulmonary    Oxygen at home:    Productive cough:     Wheezing:         Neurologic    Sudden weakness in arms or legs:     Sudden numbness in arms or legs:     Sudden onset of difficulty speaking or slurred speech:    Temporary loss of vision in one eye:     Problems with dizziness:         Gastrointestinal    Blood in stool:     Vomited blood:         Genitourinary    Burning when urinating:     Blood in urine:        Psychiatric    Major depression:         Hematologic    Bleeding problems:    Problems with blood clotting too easily:        Skin    Rashes or ulcers:        Constitutional    Fever or chills:     PHYSICAL EXAM:   Vitals:   10/16/18 1145  BP: (!) 139/94  Pulse: 60  Resp: 20  Temp: 98.5 F (36.9 C)  SpO2: 97%  Weight: 192 lb (87.1 kg)  Height: 5\' 10"  (1.778 m)    GENERAL: The  patient is a well-nourished male, in no acute distress. The vital signs are documented above. CARDIAC: There is a regular rate and rhythm.  VASCULAR: I do not detect carotid bruits. He has palpable pedal pulses. He has no significant lower extremity swelling. PULMONARY: There is good air exchange bilaterally without wheezing or rales. ABDOMEN: Soft and non-tender with normal pitched bowel sounds.  MUSCULOSKELETAL: There are no major deformities or cyanosis. NEUROLOGIC: No focal weakness or paresthesias are detected. SKIN: There are no ulcers or rashes noted. PSYCHIATRIC: The patient has a normal affect.  DATA:    VENOUS DUPLEX: I have independently interpreted his venous duplex scan today.  There has been partial recanalization of the femoral vein proximally and distally.  The popliteal vein has some echogenicity to suggest some chronic clot.  MEDICAL ISSUES:   LEFT LOWER EXTREMITY DVT: The patient has partially recanalized the DVT in the left common femoral vein, femoral vein, and popliteal vein.  He has been on Eliquis now coming up on 6 months and I have instructed him to continue taking the Eliquis through April.  We will then try to set him up for a hematology consult to work him up for an underlying hypercoagulable condition.  We have at his skin discussed the importance of exercise and daily leg elevation.  He also wears his compression stocking which does seem to help.  I will see him back as needed.  Waverly Ferrari [facility] [phone]

## 2018-10-22 ENCOUNTER — Telehealth: Payer: Self-pay | Admitting: Hematology

## 2018-10-22 NOTE — Telephone Encounter (Signed)
A new hem appt has been scheduled for the pt to see Dr. Mosetta Putt on 5/7 at 8:15am. Pt aware to arrive 15 minutes early.

## 2018-11-22 ENCOUNTER — Other Ambulatory Visit: Payer: Self-pay | Admitting: *Deleted

## 2018-11-22 DIAGNOSIS — I82412 Acute embolism and thrombosis of left femoral vein: Secondary | ICD-10-CM

## 2018-11-22 DIAGNOSIS — I4891 Unspecified atrial fibrillation: Secondary | ICD-10-CM

## 2018-11-22 DIAGNOSIS — D6869 Other thrombophilia: Secondary | ICD-10-CM

## 2018-11-26 NOTE — Progress Notes (Signed)
[facility]   [phone] Fax:(336) [phone]   Clinic New Consult Note   Patient Care Team: Ronnald Nian, MD as PCP - General (Family Medicine)  Date of Service:  11/28/2018  CHIEF COMPLAINTS/PURPOSE OF CONSULTATION:  Recurrent PE and DVT   REFERRING PHYSICIAN:  Vascular Surgeon, Dr. Edilia Bo   HISTORY OF PRESENTING ILLNESS:  Jonathan Archer 48 y.o. male is a here because of recurrent DVT and PE. The patient was referred by vascular surgeon Dr. Chestine Spore. The patient presents to the clinic today by himself.   He notes having left leg DVT 12-13 years ago after minor left meniscus surgery. The next day after surgery he had foot and ankle pain along with swelling from surgery. He had chest pain that same night and the next day he went to the ED found he had multiple left leg DVT and a PE. He was on Coumadin for 6 months following that.   06/2017 he was playing soccer and tore his MCL and ACL. He delayed his surgery due to his mother passing. His mother, father and sister had heart attacks which his parent did not survive. In 04/2018 he went ahead with cardiologist, Dr. Tomie China, work up which showed PE again. He feels his clots came from his knee brace or injury. He was having SOB for about 2 months at the time. He notes he was on Eliquis. His vascular surgeon said he does not have to restart after hypercoagulation lab work. He still has not had ACL surgery due to blood clots and he would like to be cleared for surgery. His doppler in 06/2018 showed small residual blood clots.   He notes having improved SOB upon significant exertion. He no longer gets tired walking up steps. He lost 15 pounds and overall feels better. He is not as active as before due to his left knee pain. He notes he plays golf, hockey, water skiing as well. He tries goes for walks now.   Socially he is single with 3 children. He works in Air[employer]. He does not smoke cigarettes or drink alcohol. He does  smoke marijuana. He notes his middle son has epilepsy since birth.   They have a PMHx of surgery on right knee twice and 2 surgeries on his left knee. First 3 surgeries were over 20 years ago. He denies DM and HTN. He notes having right sided facial numbness and tingling. He sees a neurologist. He notes this is related to orthopedist about his unaligned spine which can cause his neurological symptoms.  He notes both of his grandmother had blood clots in their 24s. His father had sattle clot in his lung at age 47.    REVIEW OF SYSTEMS:   Constitutional: Denies fevers, chills or abnormal night sweats Eyes: Denies blurriness of vision, double vision or watery eyes Ears, nose, mouth, throat, and face: Denies mucositis or sore throat Respiratory: Denies cough or wheezes (+) Improved SOB  Cardiovascular: Denies palpitation, chest discomfort or lower extremity swelling Gastrointestinal:  Denies nausea, heartburn or change in bowel habits Skin: Denies abnormal skin rashes MSK: (+) Left knee pain from ACL injury (+) unaligned spine/posture  Lymphatics: Denies new lymphadenopathy or easy bruising Neurological:Denies numbness, tingling or new weaknesses Behavioral/Psych: Mood is stable, no new changes (+) Right sided facial numbness and tingling  All other systems were reviewed with the patient and are negative.  MEDICAL HISTORY:  Past Medical History:  Diagnosis Date  . DVT (deep venous thrombosis) (HCC)   .  ED (erectile dysfunction)   . History of pulmonary embolus (PE)   . Hypogonadism male   . Paresthesia     SURGICAL HISTORY: Past Surgical History:  Procedure Laterality Date  . MENISECTOMY  2006    SOCIAL HISTORY: Social History   Socioeconomic History  . Marital status: Single    Spouse name: Not on file  . Number of children: 3  . Years of education: college  . Highest education level: Not on file  Occupational History  . Occupation: Chief Strategy Officer  . Financial  resource strain: Not on file  . Food insecurity:    Worry: Not on file    Inability: Not on file  . Transportation needs:    Medical: Not on file    Non-medical: Not on file  Tobacco Use  . Smoking status: Never Smoker  . Smokeless tobacco: Never Used  Substance and Sexual Activity  . Alcohol use: Yes    Comment: rarely  . Drug use: Yes    Types: Marijuana    Comment: small amount daily  . Sexual activity: Not on file  Lifestyle  . Physical activity:    Days per week: Not on file    Minutes per session: Not on file  . Stress: Not on file  Relationships  . Social connections:    Talks on phone: Not on file    Gets together: Not on file    Attends religious service: Not on file    Active member of club or organization: Not on file    Attends meetings of clubs or organizations: Not on file    Relationship status: Not on file  . Intimate partner violence:    Fear of current or ex partner: Not on file    Emotionally abused: Not on file    Physically abused: Not on file    Forced sexual activity: Not on file  Other Topics Concern  . Not on file  Social History Narrative   Lives at home with his son.   Left-sided.   1-2 cups of Pepsi per day.    FAMILY HISTORY: Family History  Problem Relation Age of Onset  . Heart disease Sister   . Heart attack Sister   . Heart disease Mother   . Heart attack Mother   . Heart disease Father   . Heart attack Father   . Clotting disorder Father   . Clotting disorder Maternal Grandmother   . Clotting disorder Paternal Grandmother     ALLERGIES:  is allergic to vicodin [hydrocodone-acetaminophen].  MEDICATIONS:  Current Outpatient Medications  Medication Sig Dispense Refill  . Menthol, Topical Analgesic, (BIOFREEZE EX) Apply 1 application topically as needed (leg pain).    Marland Kitchen albuterol (PROVENTIL HFA;VENTOLIN HFA) 108 (90 Base) MCG/ACT inhaler Inhale 2 puffs into the lungs every 6 (six) hours as needed for wheezing or shortness of  breath. (Patient not taking: Reported on 11/28/2018) 1 Inhaler 0  . apixaban (ELIQUIS) 5 MG TABS tablet Take 1 tablet (5 mg total) by mouth 2 (two) times daily. (Patient not taking: Reported on 11/28/2018) 180 tablet 2  . nitroGLYCERIN (NITROSTAT) 0.4 MG SL tablet Place 1 tablet (0.4 mg total) under the tongue every 5 (five) minutes as needed. (Patient not taking: Reported on 08/02/2018) 25 tablet 11   No current facility-administered medications for this visit.     PHYSICAL EXAMINATION: ECOG PERFORMANCE STATUS: 0 - Asymptomatic  Vitals:   11/28/18 0825  BP: 130/89  Pulse: (!) 54  Resp: 18  Temp: 97.7 F (36.5 C)  SpO2: 100%   Filed Weights   11/28/18 0825  Weight: 194 lb 12.8 oz (88.4 kg)    GENERAL:alert, no distress and comfortable SKIN: skin color, texture, turgor are normal, no rashes or significant lesions EYES: normal, conjunctiva are pink and non-injected, sclera clear OROPHARYNX:no exudate, no erythema and lips, buccal mucosa, and tongue normal  NECK: supple, thyroid normal size, non-tender, without nodularity LYMPH:  no palpable lymphadenopathy in the cervical, axillary or inguinal LUNGS: clear to auscultation and percussion with normal breathing effort HEART: regular rate & rhythm and no murmurs and no lower extremity edema ABDOMEN:abdomen soft, non-tender and normal bowel sounds Musculoskeletal:no cyanosis of digits and no clubbing, no edema   PSYCH: alert & oriented x 3 with fluent speech NEURO: no focal motor/sensory deficits  LABORATORY DATA:  I have reviewed the data as listed No results found for this or any previous visit (from the past 2160 hour(s)).  RADIOGRAPHIC STUDIES: I have personally reviewed the radiological images as listed and agreed with the findings in the report. No results found.  ASSESSMENT:  PIUS BYROM is a 48 y.o. Caucasian male with  1. Recurrent DVT/PE, provoked -He notes having left leg DVT 12-13 years ago after minor left meniscus  surgery. Symptoms of left ankle and left foot pain along with swelling presented the next day, before admission to ED. He was found to have left leg DVT and PE. He was on Coumadin for 6 months following that.  -Second episode was found 10 months after  left ACL and MCL tear in 06/2017. Preop evaluation showed left LE DVT and small PE. He had SOB for 2 months at that time. He was placed on Eliquis for 6 months and has stopped one week ago.  -His LE Doppler in 06/2018 showed residual blood clots.  -His two episodes were all provoked, secondary to knee injury and surgery. I discussed his causes are less likely genetic, but I will do hypercoagulopathy workup. He is agreeable. We also discussed other secondary cause of venous thrombosis, such as sedentary lifestyle, smoking, pulmonary replacement, malignancy, and hematological disorders, etc. His CBC is normal, no suspicion for MPN, he is young and other asymptomatic, no high concerns for malignancy.  -Due to his recurrent thrombosis, and persistent left knee injury (he is waiting for surgery), he is at high risk of recurrent venous thrombosis especially in left leg, I recommend he restart Eliquis to reduce risk of recurrence as this is convenient and tolerable. I reviewed side effects of bleeding. He understands. He will continue indefinitely.  -He is fine to proceed with left knee surgery, based on his risk for bleeding he can hold Eliquis for a few days before surgery. I will send note to his vascular and orthopedic surgeons.    -I reinforced the importance of preventive strategies such as avoiding hormonal supplement, avoiding cigarette smoking, keeping up-to-date with screening programs for early cancer detection, frequent ambulation for long distance travel and aggressive DVT prophylaxis in all surgical settings. I also recommend the patient to use elastic compression stockings at 20-30 mmHg to reduce risks of chronic thrombophlebitis. -He is overall  healthy, exam today was unremarkable. Will proceed with baseline labs and hypercoagulation work up today. -f/u open, as needed    2. Cancer Screening  -Cancer can increase risk of blood clots. I strongly encouraged him to start and continue age-appropriate cancer screenings such as colon cancer and prostate cancer screening starting at  the age of 650. He is agreeable.   PLAN:  -Lab today for hypercoagulopathy  -restart Eliquis, continue indefinitely due to his high risk from previous recurrent DVT/PE  -F/u open, as needed   Orders Placed This Encounter  Procedures  . CBC with Differential (Cancer Center Only)    Standing Status:   Future    Standing Expiration Date:   11/28/2019  . Antithrombin III  . Protein C activity  . Protein C, total  . Protein S activity  . Protein S, total  . Lupus anticoagulant panel  . Beta-2-glycoprotein i abs, IgG/M/A  . Homocysteine, serum  . Factor 5 leiden  . Prothrombin gene mutation  . Cardiolipin antibodies, IgG, IgM, IgA     All questions were answered. The patient knows to call the clinic with any problems, questions or concerns. I spent 30 minutes counseling the patient face to face. The total time spent in the appointment was 45 minutes and more than 50% was on counseling.     Malachy MoodYan Alicia Seib, MD 11/28/2018 9:13 AM   I, Delphina CahillAmoya Bennett, am acting as scribe for Malachy MoodYan Journe Hallmark, MD.   I have reviewed the above documentation for accuracy and completeness, and I agree with the above.

## 2018-11-28 ENCOUNTER — Inpatient Hospital Stay: Payer: 59 | Attending: Hematology | Admitting: Hematology

## 2018-11-28 ENCOUNTER — Other Ambulatory Visit: Payer: Self-pay

## 2018-11-28 ENCOUNTER — Encounter: Payer: Self-pay | Admitting: Hematology

## 2018-11-28 ENCOUNTER — Telehealth: Payer: Self-pay | Admitting: Hematology

## 2018-11-28 ENCOUNTER — Inpatient Hospital Stay: Payer: 59

## 2018-11-28 VITALS — BP 130/89 | HR 54 | Temp 97.7°F | Resp 18 | Ht 70.0 in | Wt 194.8 lb

## 2018-11-28 DIAGNOSIS — Z7901 Long term (current) use of anticoagulants: Secondary | ICD-10-CM | POA: Diagnosis not present

## 2018-11-28 DIAGNOSIS — Z86718 Personal history of other venous thrombosis and embolism: Secondary | ICD-10-CM | POA: Diagnosis present

## 2018-11-28 DIAGNOSIS — Z8249 Family history of ischemic heart disease and other diseases of the circulatory system: Secondary | ICD-10-CM | POA: Diagnosis not present

## 2018-11-28 DIAGNOSIS — Z7289 Other problems related to lifestyle: Secondary | ICD-10-CM | POA: Diagnosis not present

## 2018-11-28 DIAGNOSIS — Z885 Allergy status to narcotic agent status: Secondary | ICD-10-CM | POA: Diagnosis not present

## 2018-11-28 DIAGNOSIS — M79672 Pain in left foot: Secondary | ICD-10-CM | POA: Diagnosis not present

## 2018-11-28 DIAGNOSIS — Z79899 Other long term (current) drug therapy: Secondary | ICD-10-CM | POA: Insufficient documentation

## 2018-11-28 DIAGNOSIS — Z86711 Personal history of pulmonary embolism: Secondary | ICD-10-CM | POA: Diagnosis not present

## 2018-11-28 DIAGNOSIS — R200 Anesthesia of skin: Secondary | ICD-10-CM | POA: Diagnosis not present

## 2018-11-28 DIAGNOSIS — R202 Paresthesia of skin: Secondary | ICD-10-CM

## 2018-11-28 DIAGNOSIS — F1290 Cannabis use, unspecified, uncomplicated: Secondary | ICD-10-CM

## 2018-11-28 DIAGNOSIS — I82412 Acute embolism and thrombosis of left femoral vein: Secondary | ICD-10-CM

## 2018-11-28 LAB — CBC WITH DIFFERENTIAL (CANCER CENTER ONLY)
Abs Immature Granulocytes: 0.01 10*3/uL (ref 0.00–0.07)
Basophils Absolute: 0 10*3/uL (ref 0.0–0.1)
Basophils Relative: 0 %
Eosinophils Absolute: 0.1 10*3/uL (ref 0.0–0.5)
Eosinophils Relative: 1 %
HCT: 46.7 % (ref 39.0–52.0)
Hemoglobin: 15.4 g/dL (ref 13.0–17.0)
Immature Granulocytes: 0 %
Lymphocytes Relative: 32 %
Lymphs Abs: 1.6 10*3/uL (ref 0.7–4.0)
MCH: 29.9 pg (ref 26.0–34.0)
MCHC: 33 g/dL (ref 30.0–36.0)
MCV: 90.7 fL (ref 80.0–100.0)
Monocytes Absolute: 0.3 10*3/uL (ref 0.1–1.0)
Monocytes Relative: 7 %
Neutro Abs: 2.9 10*3/uL (ref 1.7–7.7)
Neutrophils Relative %: 60 %
Platelet Count: 212 10*3/uL (ref 150–400)
RBC: 5.15 MIL/uL (ref 4.22–5.81)
RDW: 12.9 % (ref 11.5–15.5)
WBC Count: 4.9 10*3/uL (ref 4.0–10.5)
nRBC: 0 % (ref 0.0–0.2)

## 2018-11-28 LAB — ANTITHROMBIN III: AntiThromb III Func: 112 % (ref 75–120)

## 2018-11-28 LAB — D-DIMER, QUANTITATIVE: D-Dimer, Quant: 0.32 ug/mL-FEU (ref 0.00–0.50)

## 2018-11-28 NOTE — Telephone Encounter (Signed)
Scheduled lab per 5/7 sch message.  F/u open per 5/7 los.

## 2018-11-29 LAB — PROTEIN S, TOTAL: Protein S Ag, Total: 99 % (ref 60–150)

## 2018-11-29 LAB — HOMOCYSTEINE: Homocysteine: 14.2 umol/L (ref 0.0–14.5)

## 2018-11-29 LAB — BETA-2-GLYCOPROTEIN I ABS, IGG/M/A
Beta-2 Glyco I IgG: <9 GPI IgG units (ref 0–20)
Beta-2-Glycoprotein I IgA: <9 GPI IgA units (ref 0–25)
Beta-2-Glycoprotein I IgM: <9 GPI IgM units (ref 0–32)

## 2018-11-29 LAB — PROTEIN S ACTIVITY: Protein S Activity: 111 % (ref 63–140)

## 2018-11-29 LAB — LUPUS ANTICOAGULANT PANEL
DRVVT: 36.2 s (ref 0.0–47.0)
PTT Lupus Anticoagulant: 30 s (ref 0.0–51.9)

## 2018-11-29 LAB — CARDIOLIPIN ANTIBODIES, IGG, IGM, IGA
Anticardiolipin IgA: <9 APL U/mL (ref 0–11)
Anticardiolipin IgG: <9 GPL U/mL (ref 0–14)
Anticardiolipin IgM: <9 MPL U/mL (ref 0–12)

## 2018-11-29 LAB — PROTEIN C ACTIVITY: Protein C Activity: 153 % (ref 73–180)

## 2018-11-29 LAB — PROTEIN C, TOTAL: Protein C, Total: 119 % (ref 60–150)

## 2018-11-30 ENCOUNTER — Encounter: Payer: Self-pay | Admitting: Hematology

## 2018-12-03 LAB — PROTHROMBIN GENE MUTATION

## 2018-12-04 LAB — FACTOR 5 LEIDEN

## 2018-12-05 ENCOUNTER — Telehealth: Payer: Self-pay

## 2018-12-05 NOTE — Telephone Encounter (Signed)
Spoke with patient regarding lab results.  Per Dr. Mosetta Putt  hypercoagulopathy work up was all negative, patient verbalized an understanding.  He asked he Dr. Mosetta Putt would contact his orthopedic surgeon to clear him for surgery.   Routed this Dr. Mosetta Putt.

## 2018-12-05 NOTE — Telephone Encounter (Signed)
-----   Message from Malachy Mood, MD sent at 12/05/2018  8:20 AM EDT ----- Elnita Maxwell, please let pt know his hypercoagulopathy work up was all negative, thanks   [employer]  12/05/2018

## 2018-12-05 NOTE — Telephone Encounter (Signed)
Elnita Maxwell, could you call [facility] to see who is his Careers adviser and ask him to call me? Thanks much.   Malachy Mood MD

## 2018-12-17 ENCOUNTER — Telehealth (HOSPITAL_COMMUNITY): Payer: Self-pay | Admitting: Rehabilitation

## 2018-12-17 NOTE — Telephone Encounter (Signed)
The above patient or their representative was contacted and gave the following answers to these questions:         Do you have any of the following symptoms? No  Fever                    Cough                   Shortness of breath  Do  you have any of the following other symptoms? No   muscle pain         vomiting,        diarrhea        rash         weakness        red eye        abdominal pain         bruising          bruising or bleeding              joint pain           severe headache    Have you been in contact with someone who was or has been sick in the past 2 weeks? No  Yes                 Unsure                         Unable to assess   Does the person that you were in contact with have any of the following symptoms?   Cough         shortness of breath           muscle pain         vomiting,            diarrhea            rash            weakness           fever            red eye           abdominal pain           bruising  or  bleeding                joint pain                severe headache               Have you  or someone you have been in contact with traveled internationally in th last month?  No       If yes, which countries?   Have you  or someone you have been in contact with traveled outside [place] in th last month?  Pt states that he has not, but it is likely that he has been in contact with others that have traveled to different places out of [place].       If yes, which state and city?   COMMENTS OR ACTION PLAN FOR THIS PATIENT:

## 2018-12-18 ENCOUNTER — Ambulatory Visit (INDEPENDENT_AMBULATORY_CARE_PROVIDER_SITE_OTHER): Payer: 59 | Admitting: Vascular Surgery

## 2018-12-18 ENCOUNTER — Other Ambulatory Visit: Payer: Self-pay

## 2018-12-18 ENCOUNTER — Encounter: Payer: Self-pay | Admitting: Vascular Surgery

## 2018-12-18 VITALS — BP 147/81 | HR 62 | Temp 98.3°F | Resp 18 | Ht 70.0 in | Wt 188.0 lb

## 2018-12-18 DIAGNOSIS — I82412 Acute embolism and thrombosis of left femoral vein: Secondary | ICD-10-CM | POA: Diagnosis not present

## 2018-12-18 NOTE — Progress Notes (Signed)
Patient name: Jonathan Archer MRN: [id number] DOB: [birth year] Sex: male  REASON FOR VISIT:   Follow-up of left lower extremity DVT and pulmonary embolus  HPI:   Jonathan Archer is a pleasant 47 y.o. male who I last saw on 10/16/2018.  I had originally seen him in consultation in October 2019 with a left lower extremity DVT and small pulmonary embolus.  The clot extended from the tibial veins to the common femoral vein on the left.  Based on his history it was difficult to determine how long the clot had been present.  He had minimal leg swelling and therefore we did not pursue thrombolysis.  He was put on Eliquis.  On a follow-up visit on 07/10/2018 there was partial recanalization of the left femoral vein and popliteal vein.  I recommended another 3 months of Eliquis.  In March his duplex scan showed partial recanalization of the femoral vein proximally and distally.  The popliteal vein had some echogenicity to suggest chronic clot.  I thought he should continue taking his Eliquis through April.  At that point we could stop and and he could undergo hypercoagulable work-up.  He also talked about the importance of compression stockings and leg elevation.  He comes in for his follow-up visit.  Of note he was seen by hematology, Dr. Mosetta Putt on 11/28/2018.  It was felt that both of his episodes of DVT were provoked and therefore were less likely genetic.  He did undergo a hypercoagulable work-up for completeness however.  According to the patient this was unremarkable.  I did review the labs that were sent on 11/28/2018.  It looks like his protein C, protein S, lupus anticoagulant, anti-thrombin were all normal.  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  The plan was to continue this indefinitely.  It was felt that he was fine for him to proceed with  knee surgery and that he can hold his Eliquis a few days prior to surgery.  Since I saw him last he has done very well.  He is lost 20 pounds.  He has been more active.  He is doing a good job of listening to what he can tolerate physically.  He is scheduled probably for knee surgery in the fall.  He said no chest pain or shortness of breath.  Past Medical History:  Diagnosis Date  . DVT (deep venous thrombosis) (HCC)   . ED (erectile dysfunction)   . History of pulmonary embolus (PE)   . Hypogonadism male   . Paresthesia     Family History  Problem Relation Age of Onset  . Heart disease Sister   . Heart attack Sister   . Heart disease Mother   . Heart attack Mother   . Heart disease Father   . Heart attack Father   . Clotting disorder Father   . Clotting disorder Maternal Grandmother   . Clotting disorder Paternal Grandmother     SOCIAL HISTORY: Social History   Tobacco Use  . Smoking status: Never Smoker  . Smokeless tobacco: Never Used  Substance Use Topics  . Alcohol use: Yes    Comment: rarely    Allergies  Allergen Reactions  . Vicodin [Hydrocodone-Acetaminophen] Other (See Comments)    Eyes burn    Current Outpatient Medications  Medication Sig Dispense Refill  .  albuterol (PROVENTIL HFA;VENTOLIN HFA) 108 (90 Base) MCG/ACT inhaler Inhale 2 puffs into the lungs every 6 (six) hours as needed for wheezing or shortness of breath. 1 Inhaler 0  . apixaban (ELIQUIS) 5 MG TABS tablet Take 1 tablet (5 mg total) by mouth 2 (two) times daily. 180 tablet 2  . Menthol, Topical Analgesic, (BIOFREEZE EX) Apply 1 application topically as needed (leg pain).    . nitroGLYCERIN (NITROSTAT) 0.4 MG SL tablet Place 1 tablet (0.4 mg total) under the tongue every 5 (five) minutes as needed. (Patient not taking: Reported on 08/02/2018) 25 tablet 11   No current facility-administered medications for this visit.     REVIEW OF SYSTEMS:  [X]  denotes positive finding, [ ]  denotes  negative finding Cardiac  Comments:  Chest pain or chest pressure:    Shortness of breath upon exertion: x   Short of breath when lying flat:    Irregular heart rhythm:        Vascular    Pain in calf, thigh, or hip brought on by ambulation: x   Pain in feet at night that wakes you up from your sleep:     Blood clot in your veins: x   Leg swelling:  x       Pulmonary    Oxygen at home:    Productive cough:     Wheezing:         Neurologic    Sudden weakness in arms or legs:     Sudden numbness in arms or legs:     Sudden onset of difficulty speaking or slurred speech:    Temporary loss of vision in one eye:     Problems with dizziness:         Gastrointestinal    Blood in stool:     Vomited blood:         Genitourinary    Burning when urinating:     Blood in urine:        Psychiatric    Major depression:         Hematologic    Bleeding problems:    Problems with blood clotting too easily: x       Skin    Rashes or ulcers:        Constitutional    Fever or chills:     PHYSICAL EXAM:   Vitals:   12/18/18 1258  BP: (!) 147/81  Pulse: 62  Resp: 18  Temp: 98.3 F (36.8 C)  TempSrc: Oral  SpO2: 99%  Weight: 188 lb (85.3 kg)  Height: 5\' 10"  (1.778 m)    GENERAL: The patient is a well-nourished male, in no acute distress. The vital signs are documented above. CARDIAC: There is a regular rate and rhythm.  VASCULAR: I do not detect carotid bruits. He has palpable pedal pulses. He has no significant leg swelling. PULMONARY: There is good air exchange bilaterally without wheezing or rales. ABDOMEN: Soft and non-tender with normal pitched bowel sounds.  MUSCULOSKELETAL: There are no major deformities or cyanosis. NEUROLOGIC: No focal weakness or paresthesias are detected. SKIN: There are no ulcers or rashes noted. PSYCHIATRIC: The patient has a normal affect.  DATA:    No new data.  MEDICAL ISSUES:   HISTORY OF LEFT LOWER EXTREMITY DVT: The patient  has been evaluated by hematology.  His hypercoagulable work-up was negative.  It was felt however that given his recurrent episodes of DVT that he should be kept on Eliquis indefinitely.  He has been okayed for knee surgery from that standpoint.  I will see him back as needed.  He knows to call if he develops any new problems.  We have again discussed the importance of intermittent leg elevation and compression therapy.  I also encouraged water aerobics which would be very helpful in his situation I think  Waverly [facility] [phone]

## 2019-01-08 ENCOUNTER — Telehealth: Payer: Self-pay

## 2019-01-08 NOTE — Telephone Encounter (Signed)
Faxed signed orders to [facility] for medical clearance for surgery, sent to HIM for scanning to chart.

## 2019-02-13 ENCOUNTER — Telehealth: Payer: Self-pay | Admitting: Emergency Medicine

## 2019-02-13 NOTE — Telephone Encounter (Signed)
Returning pt's call from yesterday afternoon asking when he should stop and restart his Eliquis before and after his upcoming surgery.  Let pt know that he should stop the blood thinner 2-3 days before his surgery and restart it 1-3 days after his surgery.  Pt verbalized understanding of instructions, aware that this information was also sent to Dr. Theda Sers on 01/07/2019 but wanted to double verify these instructions with Dr. Burr Medico.  Denies any further questions or concerns at this time.

## 2019-03-20 ENCOUNTER — Telehealth: Payer: Self-pay | Admitting: Cardiology

## 2019-03-20 NOTE — Telephone Encounter (Signed)
°*  STAT* If patient is at the pharmacy, call can be transferred to refill team.   1. Which medications need to be refilled? (please list name of each medication and dose if known) Eliquis  2. Which pharmacy/location (including street and city if local pharmacy) is medication to be sent to?[facility] on [address]  3. Do they need a 30 day or 90 day supply? [place]

## 2019-03-21 ENCOUNTER — Other Ambulatory Visit: Payer: Self-pay

## 2019-03-21 MED ORDER — APIXABAN 5 MG PO TABS: 5.0000 mg | ORAL_TABLET | Freq: Two times a day (BID) | ORAL | 1 refills | Status: DC

## 2019-05-14 ENCOUNTER — Other Ambulatory Visit: Payer: Self-pay | Admitting: Cardiology

## 2019-05-23 ENCOUNTER — Other Ambulatory Visit: Payer: Self-pay

## 2019-05-23 ENCOUNTER — Encounter: Payer: Self-pay | Admitting: Cardiology

## 2019-05-23 ENCOUNTER — Ambulatory Visit (INDEPENDENT_AMBULATORY_CARE_PROVIDER_SITE_OTHER): Payer: 59 | Admitting: Cardiology

## 2019-05-23 VITALS — BP 124/82 | HR 76 | Ht 70.0 in | Wt 191.0 lb

## 2019-05-23 DIAGNOSIS — Z86711 Personal history of pulmonary embolism: Secondary | ICD-10-CM

## 2019-05-23 DIAGNOSIS — E782 Mixed hyperlipidemia: Secondary | ICD-10-CM | POA: Diagnosis not present

## 2019-05-23 NOTE — Patient Instructions (Signed)

## 2019-05-23 NOTE — Progress Notes (Signed)
Cardiology Office Note:    Date:  05/23/2019   ID:  Jonathan Archer, DOB September 24, 1970, MRN [id number]  PCP:  Jonathan Nian, MD  Cardiologist:  Jonathan Brothers, MD   Referring MD: Jonathan Nian, MD    ASSESSMENT:    1. History of pulmonary embolism   2. Mixed dyslipidemia    PLAN:    In order of problems listed above:  1. Primary prevention stressed with the patient.  Importance of compliance with diet and medication stressed and she vocalized understanding.  His blood pressure is stable. 2. History of pulmonary embolism: Anticoagulation, benefits and potential is explained and he vocalized understanding.  Results of the CT scan explained to him at length and he was reassured. 3. Patient will be seen in follow-up appointment in 6 months or earlier if the patient has any concerns    Medication Adjustments/Labs and Tests Ordered: Current medicines are reviewed at length with the patient today.  Concerns regarding medicines are outlined above.  No orders of the defined types were placed in this encounter.  No orders of the defined types were placed in this encounter.    No chief complaint on file.    History of Present Illness:    Jonathan Archer is a 48 y.o. male.  Patient has past medical history of pulmonary embolism and is on anticoagulation for the same.  He recently underwent knee surgery and is doing very well with this and is doing the rehabilitation.  He denies any chest pain orthopnea or PND.  CT scanning was done and he is happy with the results of those tests.  They are mentioned below in detail.  At the time of my evaluation, the patient is alert awake oriented and in no distress.  Past Medical History:  Diagnosis Date  . DVT (deep venous thrombosis) (HCC)   . ED (erectile dysfunction)   . History of pulmonary embolus (PE)   . Hypogonadism male   . Paresthesia     Past Surgical History:  Procedure Laterality Date  . MENISECTOMY  2006    Current  Medications: Current Meds  Medication Sig  . albuterol (PROVENTIL HFA;VENTOLIN HFA) 108 (90 Base) MCG/ACT inhaler Inhale 2 puffs into the lungs every 6 (six) hours as needed for wheezing or shortness of breath.  . cetirizine (ZYRTEC) 10 MG tablet Take 10 mg by mouth daily.  Marland Kitchen ELIQUIS 5 MG TABS tablet TAKE 1 TABLET BY MOUTH TWICE A DAY  . Menthol, Topical Analgesic, (BIOFREEZE EX) Apply 1 application topically as needed (leg pain).  . nitroGLYCERIN (NITROSTAT) 0.4 MG SL tablet Place 1 tablet (0.4 mg total) under the tongue every 5 (five) minutes as needed.     Allergies:   Vicodin [hydrocodone-acetaminophen]   Social History   Socioeconomic History  . Marital status: Single    Spouse name: Not on file  . Number of children: 3  . Years of education: college  . Highest education level: Not on file  Occupational History  . Occupation: Chief Strategy Officer  . Financial resource strain: Not on file  . Food insecurity    Worry: Not on file    Inability: Not on file  . Transportation needs    Medical: Not on file    Non-medical: Not on file  Tobacco Use  . Smoking status: Never Smoker  . Smokeless tobacco: Never Used  Substance and Sexual Activity  . Alcohol use: Yes    Comment: rarely  .  Drug use: Yes    Types: Marijuana    Comment: small amount daily  . Sexual activity: Not on file  Lifestyle  . Physical activity    Days per week: Not on file    Minutes per session: Not on file  . Stress: Not on file  Relationships  . Social Herbalist on phone: Not on file    Gets together: Not on file    Attends religious service: Not on file    Active member of club or organization: Not on file    Attends meetings of clubs or organizations: Not on file    Relationship status: Not on file  Other Topics Concern  . Not on file  Social History Narrative   Lives at home with his son.   Left-sided.   1-2 cups of Pepsi per day.     Family History: The patient's family  history includes Clotting disorder in his father, maternal grandmother, and paternal grandmother; Heart attack in his father, mother, and sister; Heart disease in his father, mother, and sister.  ROS:   Please see the history of present illness.    All other systems reviewed and are negative.  EKGs/Labs/Other Studies Reviewed:    The following studies were reviewed today:  EKG done today reveals sinus rhythm and nonspecific ST-T changes. IMPRESSION: 1. Coronary calcium score of 0. This was 0 percentile for age and sex matched control.  2. Normal coronary origin with right dominance.  3. No evidence of CAD.  Jonathan Latch, MD   Recent Labs: 06/26/2018: BUN 11; Creatinine, Ser 1.24; Potassium 4.0; Sodium 135 11/28/2018: Hemoglobin 15.4; Platelet Count 212  Recent Lipid Panel    Component Value Date/Time   CHOL 196 01/07/2018 1453   TRIG 123 01/07/2018 1453   HDL 48 01/07/2018 1453   CHOLHDL 4.1 01/07/2018 1453   CHOLHDL 3.6 01/12/2015 0001   VLDL 13 01/12/2015 0001   LDLCALC 123 (H) 01/07/2018 1453    Physical Exam:    VS:  BP 124/82 (BP Location: Right Arm, Patient Position: Sitting)   Pulse 76   Ht 5\' 10"  (1.778 m)   Wt 191 lb (86.6 kg)   SpO2 99%   BMI 27.41 kg/m     Wt Readings from Last 3 Encounters:  05/23/19 191 lb (86.6 kg)  12/18/18 188 lb (85.3 kg)  11/28/18 194 lb 12.8 oz (88.4 kg)     GEN: Patient is in no acute distress HEENT: Normal NECK: No JVD; No carotid bruits LYMPHATICS: No lymphadenopathy CARDIAC: Hear sounds regular, 2/6 systolic murmur at the apex. RESPIRATORY:  Clear to auscultation without rales, wheezing or rhonchi  ABDOMEN: Soft, non-tender, non-distended MUSCULOSKELETAL:  No edema; No deformity  SKIN: Warm and dry NEUROLOGIC:  Alert and oriented x 3 PSYCHIATRIC:  Normal affect   Signed, Jonathan Lindau, MD  05/23/2019 9:37 AM    [facility]

## 2019-05-27 IMAGING — CT CT ANGIO CHEST
2 of 8 series · 19 of 36 positions shown · IV contrast (iopamidol)
Comparison: [DATE]

CLINICAL DATA: Evaluate for pulmonary embolus. Chest pain and
shortness of breath. Elevated D-dimer.

EXAM:
CT ANGIOGRAPHY CHEST WITH CONTRAST
TECHNIQUE: Multidetector CT imaging of the chest was performed using the
standard protocol during bolus administration of intravenous
contrast. Multiplanar CT image reconstructions and MIPs were
obtained to evaluate the vascular anatomy.
CONTRAST:  100mL SIS3ZX-4G8 IOPAMIDOL (SIS3ZX-4G8) INJECTION 76%

[Series 6: pe coronal mpr · coronal · 0.62mm/px · 1 of 118 slices shown]
[im 59/118  mediastinal]
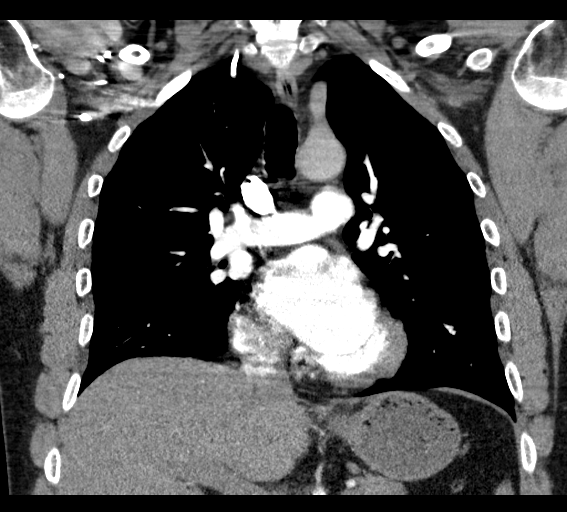

[Series 10: pe thins · axial · 0.70mm/px · z∈[-276,-16]mm · 18 of 292 slices shown]
[im 16/292  lung]
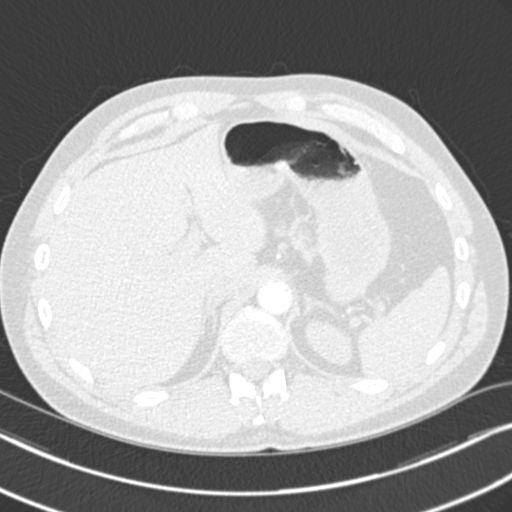
[im 31/292  mediastinal]
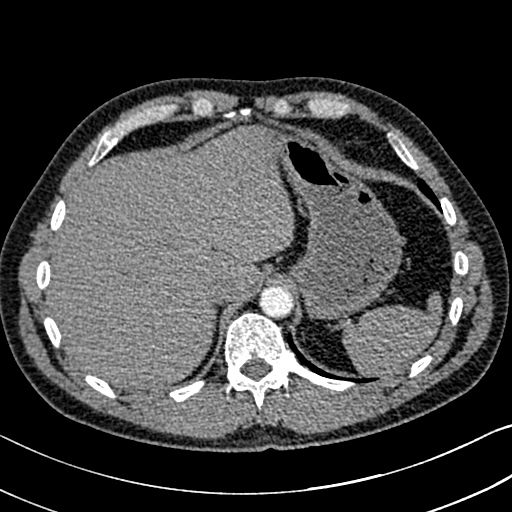
[im 46/292  lung]
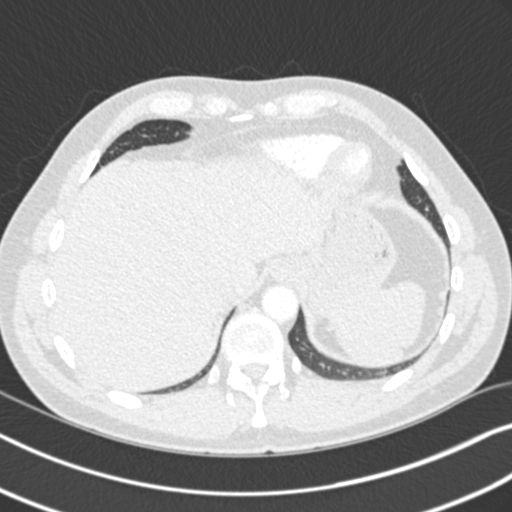
[im 62/292  mediastinal]
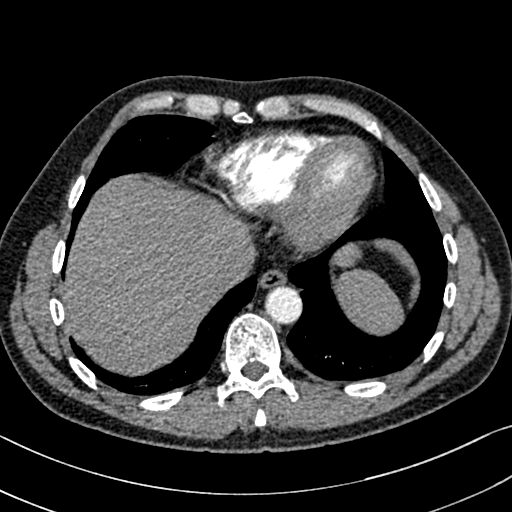
[im 77/292  lung]
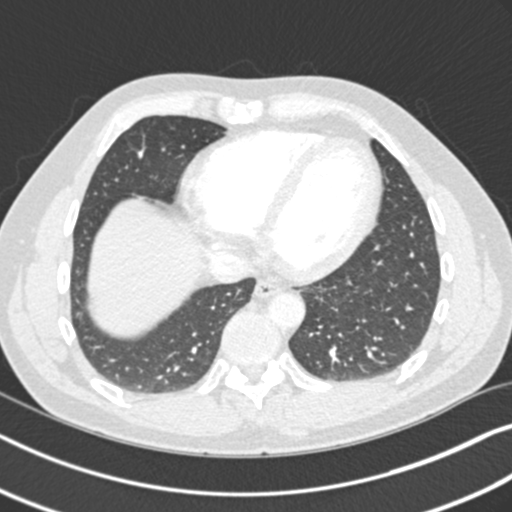
[im 92/292  mediastinal]
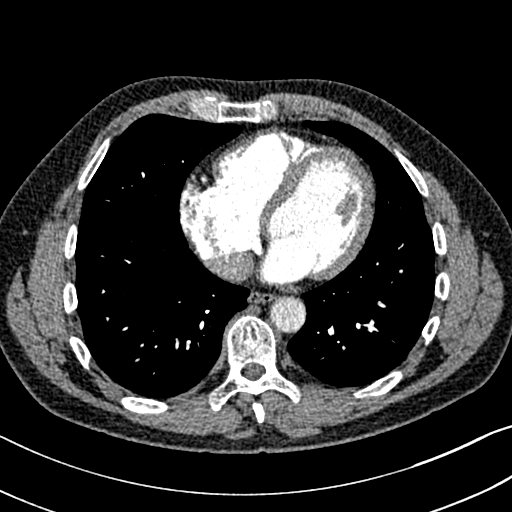
[im 108/292  lung]
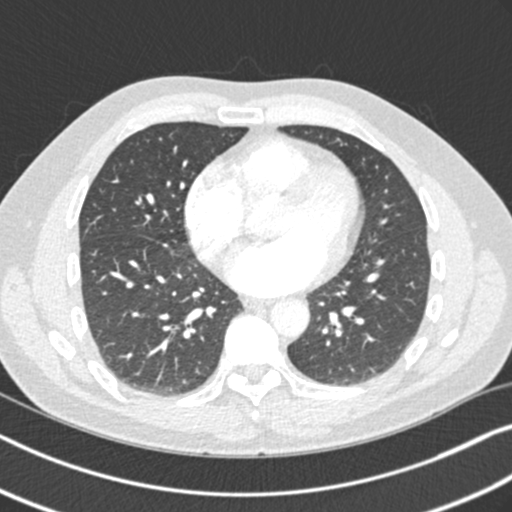
[im 123/292  mediastinal]
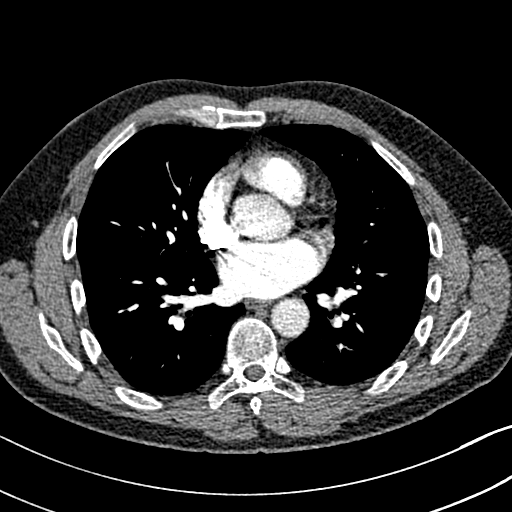
[im 138/292  lung]
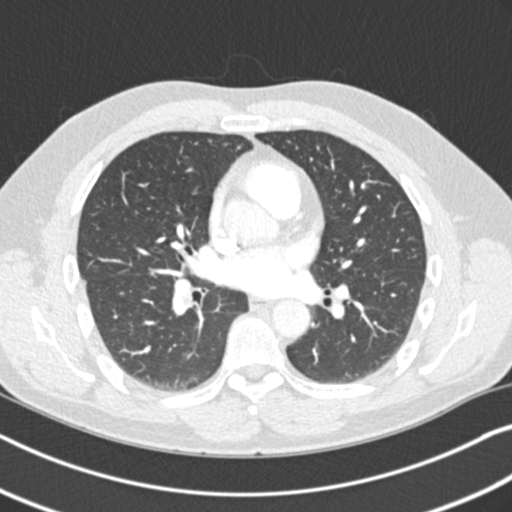
[im 154/292  mediastinal]
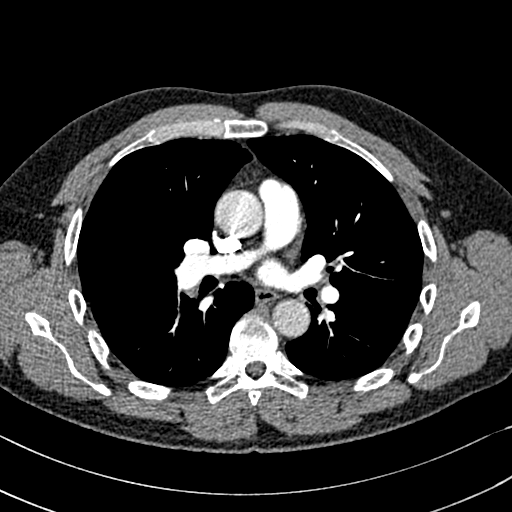
[im 169/292  lung]
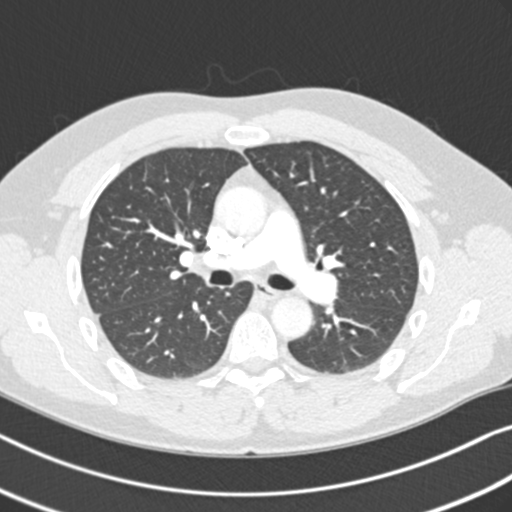
[im 184/292  mediastinal]
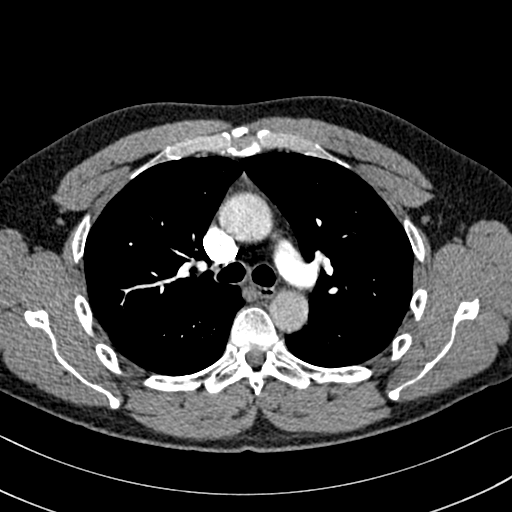
[im 200/292  lung]
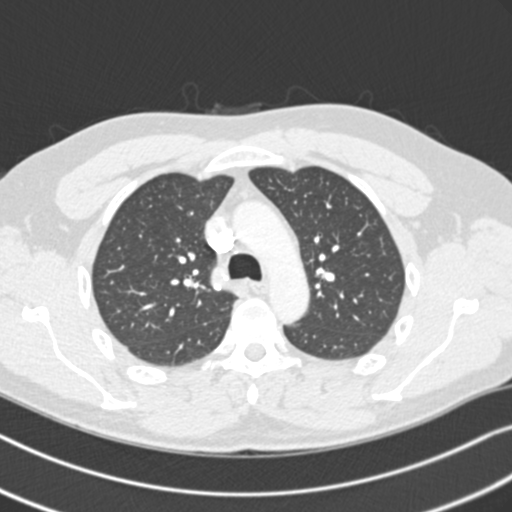
[im 215/292  mediastinal]
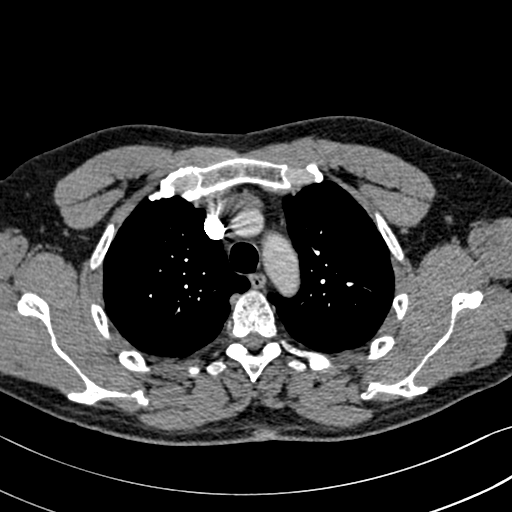
[im 230/292  lung]
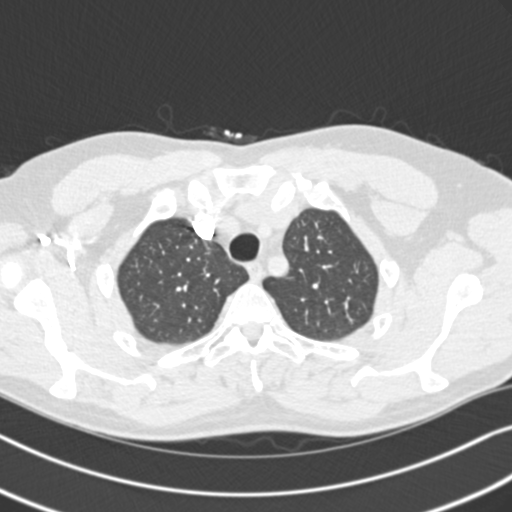
[im 246/292  mediastinal]
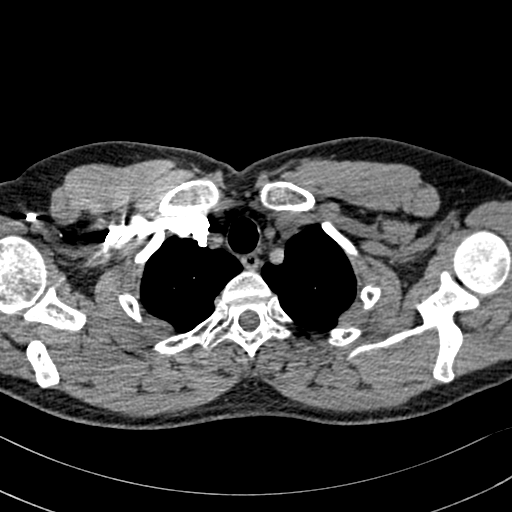
[im 261/292  lung]
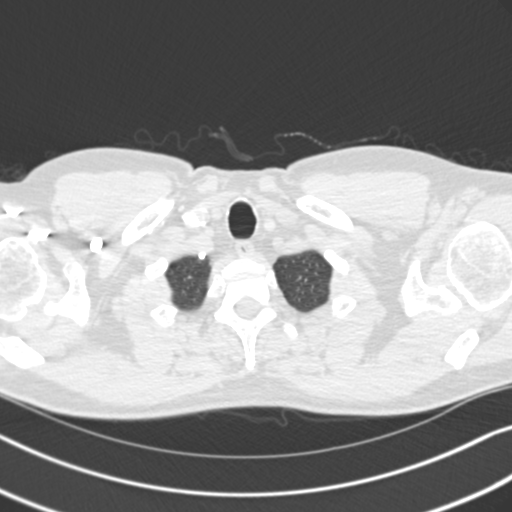
[im 276/292  mediastinal]
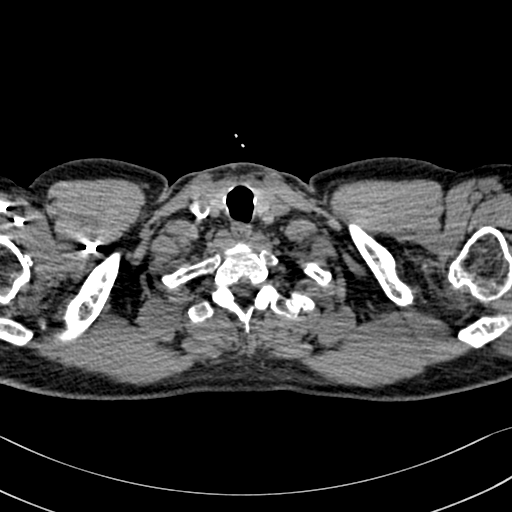

[19 of 36 positions shown; findings below may reference images not displayed]

FINDINGS: Cardiovascular: The heart size is mildly enlarged. No pericardial
effusion. Normal appearance of the main pulmonary artery. Eccentric
filling defect within the right lower lobe lobar pulmonary artery is
identified, image 75/8, favor chronic pulmonary embolus. No
centrally located pulmonary artery filling defects identified
compatible with acute pulmonary embolus.

Mediastinum/Nodes: No enlarged mediastinal, hilar, or axillary lymph
nodes. Thyroid gland, trachea, and esophagus demonstrate no
significant findings.

Lungs/Pleura: No pleural effusion. No airspace opacities. No
atelectasis or pneumothorax identified.

Upper Abdomen: No acute abnormality.

Musculoskeletal: No chest wall abnormality. No acute or significant
osseous findings.

Review of the MIP images confirms the above findings.
IMPRESSION: 1. There is a filling defect within the right lower lobe lobar
pulmonary artery which appears eccentric in location and partially
adherent to the wall of the pulmonary artery most likely reflecting
subacute to chronic pulmonary embolus. No evidence for acute
pulmonary emboli identified.

## 2019-07-22 ENCOUNTER — Ambulatory Visit (INDEPENDENT_AMBULATORY_CARE_PROVIDER_SITE_OTHER): Payer: 59 | Admitting: Family Medicine

## 2019-07-22 ENCOUNTER — Ambulatory Visit: Payer: 59 | Attending: Internal Medicine

## 2019-07-22 ENCOUNTER — Encounter: Payer: Self-pay | Admitting: Family Medicine

## 2019-07-22 ENCOUNTER — Other Ambulatory Visit: Payer: Self-pay

## 2019-07-22 VITALS — Temp 98.3°F | Wt 188.0 lb

## 2019-07-22 DIAGNOSIS — J069 Acute upper respiratory infection, unspecified: Secondary | ICD-10-CM | POA: Diagnosis not present

## 2019-07-22 DIAGNOSIS — Z20822 Contact with and (suspected) exposure to covid-19: Secondary | ICD-10-CM

## 2019-07-22 DIAGNOSIS — J301 Allergic rhinitis due to pollen: Secondary | ICD-10-CM

## 2019-07-22 DIAGNOSIS — Z9889 Other specified postprocedural states: Secondary | ICD-10-CM | POA: Diagnosis not present

## 2019-07-22 NOTE — Progress Notes (Signed)
   Subjective:    Patient ID: Jonathan Archer, male    DOB: 1971-04-12, 48 y.o.   MRN: [id number]  HPI Documentation for virtual telephone encounter.  Documentation for virtual audio and video telecommunications through [place] encounter: The patient was located at home. The provider was located in the office. The patient did consent to this visit and is aware of possible charges through their insurance for this visit. The other persons participating in this telemedicine service were none. This virtual service is not related to other E/M service within previous 7 days. He has a 3-day history that started with sore throat followed by rhinorrhea and nasal congestion, PND, dry cough and occasional wheezing.  He has been taking OTC medications for this.  He does have underlying allergies and presently is using Zyrtec.  He does not smoke.  He also has had ACL repair which seems to be going well but he is having some foot pain that is probably secondary to overuse compensation from the knee damage.  He is scheduled to see podiatry for this.    Review of Systems     Objective:   Physical Exam Alert and in no distress.  Nasal congestion noted in his voice.       Assessment & Plan:  Viral upper respiratory tract infection  S/P ACL reconstruction  Allergic rhinitis due to pollen, unspecified seasonality Recommend supportive care for the URI and also recommend he get Covid testing.  Discussed the fact that it will take 7 to 10 days to turn the corner.  If he is positive, he will be hooked into the system for follow-up.  He will keep me informed. Encouraged him to continue with his rehab for the knee and he will get follow-up concerning the foot pain which is probably an overuse injury. Recommend he continue on the Zyrtec but explained that this sometimes will wear off and he can then switch to a different antihistamine.

## 2019-07-23 LAB — NOVEL CORONAVIRUS, NAA: SARS-CoV-2, NAA: NOT DETECTED

## 2019-07-24 ENCOUNTER — Telehealth: Payer: Self-pay | Admitting: Cardiology

## 2019-07-24 MED ORDER — APIXABAN 5 MG PO TABS: 5.0000 mg | ORAL_TABLET | Freq: Two times a day (BID) | ORAL | 3 refills | Status: DC

## 2019-07-24 NOTE — Telephone Encounter (Signed)
New Message:    Pt says his refill for Eliquis is now $400.00. He wants to know if you have a discount card or samples please. He can not afford to get the Eliquis.Marland Kitchen

## 2019-07-24 NOTE — Telephone Encounter (Signed)
Returned call to Pt.  Pt states he has 6 days left of his current Eliquis.    After lengthy conversation, Pt would like to change his prescription to [facility].  Sent prescription to requested pharmacy.  Pt's copay should be only $10 as he has Pharmacist, community.    Advised Pt to call office back if he has any further difficulty with copay or needs a new coupon for [facility].  Pt thanked nurse for call.

## 2019-08-04 DIAGNOSIS — M76829 Posterior tibial tendinitis, unspecified leg: Secondary | ICD-10-CM

## 2019-08-04 HISTORY — DX: Posterior tibial tendinitis, unspecified leg: M76.829

## 2019-10-10 ENCOUNTER — Other Ambulatory Visit: Payer: Self-pay | Admitting: Orthopedic Surgery

## 2019-10-10 DIAGNOSIS — M25572 Pain in left ankle and joints of left foot: Secondary | ICD-10-CM

## 2019-11-03 ENCOUNTER — Emergency Department (HOSPITAL_BASED_OUTPATIENT_CLINIC_OR_DEPARTMENT_OTHER)
Admission: EM | Admit: 2019-11-03 | Discharge: 2019-11-03 | Disposition: A | Discharge status: Home / Self Care | Payer: 59 | Attending: Emergency Medicine | Admitting: Emergency Medicine

## 2019-11-03 ENCOUNTER — Other Ambulatory Visit: Payer: Self-pay

## 2019-11-03 ENCOUNTER — Encounter (HOSPITAL_BASED_OUTPATIENT_CLINIC_OR_DEPARTMENT_OTHER): Payer: Self-pay

## 2019-11-03 ENCOUNTER — Emergency Department (HOSPITAL_BASED_OUTPATIENT_CLINIC_OR_DEPARTMENT_OTHER): Payer: 59

## 2019-11-03 DIAGNOSIS — Z885 Allergy status to narcotic agent status: Secondary | ICD-10-CM | POA: Diagnosis not present

## 2019-11-03 DIAGNOSIS — E782 Mixed hyperlipidemia: Secondary | ICD-10-CM | POA: Insufficient documentation

## 2019-11-03 DIAGNOSIS — Z86711 Personal history of pulmonary embolism: Secondary | ICD-10-CM | POA: Insufficient documentation

## 2019-11-03 DIAGNOSIS — M79605 Pain in left leg: Secondary | ICD-10-CM | POA: Insufficient documentation

## 2019-11-03 DIAGNOSIS — Z7901 Long term (current) use of anticoagulants: Secondary | ICD-10-CM | POA: Diagnosis not present

## 2019-11-03 DIAGNOSIS — Z86718 Personal history of other venous thrombosis and embolism: Secondary | ICD-10-CM | POA: Diagnosis not present

## 2019-11-03 NOTE — ED Notes (Signed)
ED Provider at bedside. 

## 2019-11-03 NOTE — ED Triage Notes (Signed)
Pt arrives with c/o pain in left upper inner thigh states history of blood clots in legs, is on blood thinners for same. Denies missing any doses of blood thinners.

## 2019-11-03 NOTE — Discharge Instructions (Addendum)
Take Tylenol as needed for pain.  Follow-up with your doctor as we discussed.

## 2019-11-03 NOTE — ED Provider Notes (Signed)
[facility] EMERGENCY DEPARTMENT Provider Note   CSN: [id number] Arrival date & time: 11/03/19  7824     History Chief Complaint  Patient presents with  . Leg Pain    Jonathan Archer is a 49 y.o. male.  HPI   Patient presents to the emergency room for evaluation of left thigh pain.  Patient states this morning he experienced sharp pain in his left upper inner thigh.  Pain is uncomfortable and unusual for him.  He denies having similar symptoms like this in the past.  No recent trauma or injuries.  Patient is concerned that he might have a recurrent DVT.  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  He currently is on Eliquis and has not missed any doses.  Patient was diagnosed with a DVT and PE in March of last year.  Patient denies any chest pain or shortness of breath.  No fevers or chills.  No redness or swelling.  Past Medical History:  Diagnosis Date  . DVT (deep venous thrombosis) (HCC)   . ED (erectile dysfunction)   . History of pulmonary embolus (PE)   . Hypogonadism male   . Paresthesia     Patient Active Problem List   Diagnosis Date Noted  . Paresthesia 07/15/2018  . Angina pectoris (HCC) 06/27/2018  . Atypical chest pain 06/24/2018  . History of pulmonary embolism 06/24/2018  . Pulmonary embolism (HCC) 05/08/2018  . DVT (deep venous thrombosis) (HCC) 05/08/2018  . Hypercoagulability due to atrial fibrillation (HCC) 05/08/2018  . Pre-operative cardiovascular examination 04/24/2018  . Mixed dyslipidemia 04/24/2018  . 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 Family history of heart disease in male family member before age 38 04/12/2018  . Pain in left knee 10/17/2017  . Allergic rhinitis due to pollen 01/12/2015  . Arthritis of hip 09/14/2011    Past Surgical History:  Procedure Laterality Date  . MENISECTOMY  2006       Family History  Problem Relation Age of Onset  . Heart disease Sister   . Heart attack Sister   . Heart disease Mother   . Heart attack  Mother   . Heart disease Father   . Heart attack Father   . Clotting disorder Father   . Clotting disorder Maternal Grandmother   . Clotting disorder Paternal Grandmother     Social History   Tobacco Use  . Smoking status: Never Smoker  . Smokeless tobacco: Never Used  Substance Use Topics  . Alcohol use: Yes    Comment: rarely  . Drug use: Yes    Types: Marijuana    Comment: small amount daily    Home Medications Prior to Admission medications   Medication Sig Start Date End Date Taking? Authorizing Provider  albuterol (PROVENTIL HFA;VENTOLIN HFA) 108 (90 Base) MCG/ACT inhaler Inhale 2 puffs into the lungs every 6 (six) hours as needed for wheezing or shortness of breath. 01/08/18   Ronnald Nian, MD  apixaban (ELIQUIS) 5 MG TABS tablet Take 1 tablet (5 mg total) by mouth 2 (two) times daily. 07/24/19   Revankar, Aundra Dubin, MD  cetirizine (ZYRTEC) 10 MG tablet Take 10 mg by mouth daily.    [provider]  Menthol, Topical Analgesic, (BIOFREEZE EX) Apply 1 application topically as needed (leg pain).    [provider]  nitroGLYCERIN (NITROSTAT) 0.4 MG SL tablet Place 1 tablet (0.4 mg total) under the tongue every 5 (five) minutes as needed. 06/27/18 07/22/19  Revankar, Aundra Dubin,  MD    Allergies    Vicodin [hydrocodone-acetaminophen]  Review of Systems   Review of Systems  All other systems reviewed and are negative.   Physical Exam Updated Vital Signs BP (!) 136/94 (BP Location: Right Arm)   Pulse (!) 55   Temp 98.6 F (37 C) (Oral)   Resp 18   Ht 1.778 m (5\' 10" )   Wt 84.7 kg   SpO2 100%   BMI 26.79 kg/m   Physical Exam Vitals and nursing note reviewed.  Constitutional:      General: He is not in acute distress.    Appearance: He is well-developed.  HENT:     Head: Normocephalic and atraumatic.     Right Ear: External ear normal.     Left Ear: External ear normal.  Eyes:     General: No scleral icterus.       Right eye: No discharge.          Left eye: No discharge.     Conjunctiva/sclera: Conjunctivae normal.  Neck:     Trachea: No tracheal deviation.  Cardiovascular:     Rate and Rhythm: Regular rhythm. Tachycardia present.     Pulses: Normal pulses.  Pulmonary:     Effort: Pulmonary effort is normal. No respiratory distress.     Breath sounds: Normal breath sounds. No stridor. No wheezing.  Abdominal:     General: There is no distension.  Musculoskeletal:        General: No swelling or deformity.     Cervical back: Neck supple.     Right lower leg: No edema.     Left lower leg: No edema.     Comments: Mild tenderness palpation left medial thigh, no induration or edema, full range of motion, normal distal perfusion  Skin:    General: Skin is warm and dry.     Findings: No rash.  Neurological:     Mental Status: He is alert.     Cranial Nerves: Cranial nerve deficit: no gross deficits.     ED Results / Procedures / Treatments   Labs (all labs ordered are listed, but only abnormal results are displayed) Labs Reviewed - No data to display  EKG None  Radiology Venous Img Lower  Left (DVT Study)  Result Date: 11/03/2019 CLINICAL DATA:  History of left leg DVT.  New left thigh pain. EXAM: LEFT LOWER EXTREMITY VENOUS DOPPLER ULTRASOUND TECHNIQUE: Gray-scale sonography with graded compression, as well as color Doppler and duplex ultrasound were performed to evaluate the lower extremity deep venous systems from the level of the common femoral vein and including the common femoral, femoral, profunda femoral, popliteal and calf veins including the posterior tibial, peroneal and gastrocnemius veins when visible. The superficial great saphenous vein was also interrogated. Spectral Doppler was utilized to evaluate flow at rest and with distal augmentation maneuvers in the common femoral, femoral and popliteal veins. COMPARISON:  06/26/2018 FINDINGS: Contralateral Common Femoral Vein: Respiratory phasicity is normal and  symmetric with the symptomatic side. No evidence of thrombus. Normal compressibility. Common Femoral Vein: No evidence of thrombus. Normal compressibility, respiratory phasicity and response to augmentation. Saphenofemoral Junction: No evidence of thrombus. Normal compressibility and flow on color Doppler imaging. Profunda Femoral Vein: No evidence of thrombus. Normal compressibility and flow on color Doppler imaging. Femoral Vein: Positive for echogenic thrombus in the proximal, mid and distal femoral vein. Left femoral vein thrombus is nonocclusive. Popliteal Vein: Nonocclusive peripheral thrombus in the left popliteal vein. Calf  Veins: Incomplete compressibility of the left deep calf veins with some thrombus in the left posterior tibial and left peroneal veins. Superficial Great Saphenous Vein: No evidence of thrombus. Normal compressibility. Other Findings:  None. IMPRESSION: Positive for deep venous thrombosis in the left lower extremity. Nonocclusive thrombus involving the left femoral vein, left popliteal vein and left deep calf veins. Age of the thrombus is uncertain but suspect there is a chronic component to this thrombus based on the morphology. Electronically Signed   By: Markus Daft M.D.   On: 11/03/2019 08:50    Procedures Procedures (including critical care time)  Medications Ordered in ED Medications - No data to display  ED Course  I have reviewed the triage vital signs and the nursing notes.  Pertinent labs & imaging results that were available during my care of the patient were reviewed by me and considered in my medical decision making (see chart for details).  Clinical Course as of Nov 03 1011  Mon Nov 03, 2019  2423 Previous imaging reviewed.  Summary:  Right: No evidence of common femoral vein obstruction.  Left: Findings consistent with age indeterminate deep vein thrombosis  involving the left femoral vein, left proximal profunda vein, and left  popliteal vein. 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 No cystic structure found in the popliteal fossa.    *See table(s) above for measurements and observations.   Electronically signed by Deitra Mayo MD on 10/16/2018 at 12:07:01  PM.      [JK]  5361 Seen 10/31/19 by Dr Doran Durand.  Diagnosed posterior tibial tendonitis   [JK]  1000 Discussed reading with Dr Anselm Pancoast.  We reviewed report from 2020.  Report today is similar.  Suspect findings are chronic but it has not cleared much since prior imaging.   [JK]    Clinical Course User Index [JK] Dorie Rank, MD   MDM Rules/Calculators/A&P                      Patient presented to ED with complaints of new left thigh pain.  Has a history of previous DVT.  Patient is on anticoagulation.  Patient's ultrasound today does show evidence of venous thrombosis in the left femoral vein, left popliteal vein and left deep calf veins.  The age of the thrombus was felt to be indeterminant but there was suspicion for chronic component.  I did review the findings with Dr. Anselm Pancoast.  We also reviewed the vascular report from 2020, he did not have access to those images.  Those findings are similar to the findings of today.  Clinically I suspect that clot is chronic and not acute.  I will have the patient continue on his current regimen.  He could consider seeing a vascular surgeon to discuss further treatment if he has any persistent pain but I think for now it is reasonable him to follow-up with his primary care doctor to see how he is doing in the next several days. Final Clinical Impression(s) / ED Diagnoses Final diagnoses:  Pain of left lower extremity    Rx / DC Orders ED Discharge Orders    None       Dorie Rank, MD 11/03/19 1013

## 2019-11-24 ENCOUNTER — Encounter: Payer: Self-pay | Admitting: Cardiology

## 2019-11-24 ENCOUNTER — Other Ambulatory Visit: Payer: Self-pay

## 2019-11-24 ENCOUNTER — Ambulatory Visit (INDEPENDENT_AMBULATORY_CARE_PROVIDER_SITE_OTHER): Payer: 59 | Admitting: Cardiology

## 2019-11-24 ENCOUNTER — Other Ambulatory Visit: Payer: Self-pay | Admitting: Cardiology

## 2019-11-24 VITALS — BP 112/80 | HR 56 | Ht 70.0 in | Wt 188.0 lb

## 2019-11-24 DIAGNOSIS — R5383 Other fatigue: Secondary | ICD-10-CM

## 2019-11-24 DIAGNOSIS — R011 Cardiac murmur, unspecified: Secondary | ICD-10-CM

## 2019-11-24 DIAGNOSIS — R202 Paresthesia of skin: Secondary | ICD-10-CM

## 2019-11-24 DIAGNOSIS — E782 Mixed hyperlipidemia: Secondary | ICD-10-CM | POA: Diagnosis not present

## 2019-11-24 DIAGNOSIS — I209 Angina pectoris, unspecified: Secondary | ICD-10-CM

## 2019-11-24 DIAGNOSIS — M25572 Pain in left ankle and joints of left foot: Secondary | ICD-10-CM

## 2019-11-24 DIAGNOSIS — Z86711 Personal history of pulmonary embolism: Secondary | ICD-10-CM | POA: Diagnosis not present

## 2019-11-24 NOTE — Patient Instructions (Signed)
Medication Instructions:  No medication changes *If you need a refill on your cardiac medications before your next appointment, please call your pharmacy*   Lab Work: Your physician recommends that you have all labs checked today. You had a BMET, CBC, TSH,LFT's Lipids, Vit B12 and Vit D.  If you have labs (blood work) drawn today and your tests are completely normal, you will receive your results only by: Marland Kitchen MyChart Message (if you have MyChart) OR . A paper copy in the mail If you have any lab test that is abnormal or we need to change your treatment, we will call you to review the results.   Testing/Procedures: Your physician has requested that you have an echocardiogram. Echocardiography is a painless test that uses sound waves to create images of your heart. It provides your doctor with information about the size and shape of your heart and how well your heart's chambers and valves are working. This procedure takes approximately one hour. There are no restrictions for this procedure.     Follow-Up: At [facility], you and your health needs are our priority.  As part of our continuing mission to provide you with exceptional heart care, we have created designated Provider Care Teams.  These Care Teams include your primary Cardiologist (physician) and Advanced Practice Providers (APPs -  Physician Assistants and Nurse Practitioners) who all work together to provide you with the care you need, when you need it.  We recommend signing up for the patient portal called "MyChart".  Sign up information is provided on this After Visit Summary.  MyChart is used to connect with patients for Virtual Visits (Telemedicine).  Patients are able to view lab/test results, encounter notes, upcoming appointments, etc.  Non-urgent messages can be sent to your provider as well.   To learn more about what you can do with MyChart, go to [email or web address].    Your next appointment:   6 month(s)  The  format for your next appointment:   In Person  Provider:   Jyl Heinz, MD   Other Instructions  Echocardiogram An echocardiogram is a procedure that uses painless sound waves (ultrasound) to produce an image of the heart. Images from an echocardiogram can provide important information about:  Signs of coronary artery disease (CAD).  Aneurysm detection. An aneurysm is a weak or damaged part of an artery wall that bulges out from the normal force of blood pumping through the body.  Heart size and shape. Changes in the size or shape of the heart can be associated with certain conditions, including heart failure, aneurysm, and CAD.  Heart muscle function.  Heart valve function.  Signs of a past heart attack.  Fluid buildup around the heart.  Thickening of the heart muscle.  A tumor or infectious growth around the heart valves. Tell a health care provider about:  Any allergies you have.  All medicines you are taking, including vitamins, herbs, eye drops, creams, and over-the-counter medicines.  Any blood disorders you have.  Any surgeries you have had.  Any medical conditions you have.  Whether you are pregnant or may be pregnant. What are the risks? Generally, this is a safe procedure. However, problems may occur, including:  Allergic reaction to dye (contrast) that may be used during the procedure. What happens before the procedure? No specific preparation is needed. You may eat and drink normally. What happens during the procedure?   An IV tube may be inserted into one of your veins.  You may receive  contrast through this tube. A contrast is an injection that improves the quality of the pictures from your heart.  A gel will be applied to your chest.  A wand-like tool (transducer) will be moved over your chest. The gel will help to transmit the sound waves from the transducer.  The sound waves will harmlessly bounce off of your heart to allow the heart  images to be captured in real-time motion. The images will be recorded on a computer. The procedure may vary among health care providers and hospitals. What happens after the procedure?  You may return to your normal, everyday life, including diet, activities, and medicines, unless your health care provider tells you not to do that. Summary  An echocardiogram is a procedure that uses painless sound waves (ultrasound) to produce an image of the heart.  Images from an echocardiogram can provide important information about the size and shape of your heart, heart muscle function, heart valve function, and fluid buildup around your heart.  You do not need to do anything to prepare before this procedure. You may eat and drink normally.  After the echocardiogram is completed, you may return to your normal, everyday life, unless your health care provider tells you not to do that. This information is not intended to replace advice given to you by your health care provider. Make sure you discuss any questions you have with your health care provider. Document Revised: 10/31/2018 Document Reviewed: 08/12/2016 Elsevier Patient Education  2020 [employer].

## 2019-11-24 NOTE — Progress Notes (Signed)
Cardiology Office Note:    Date:  11/24/2019   ID:  Jonathan Archer, DOB Oct 29, 1970, MRN [id number]  PCP:  Ronnald Nian, MD  Cardiologist:  Garwin Brothers, MD   Referring MD: Ronnald Nian, MD    ASSESSMENT:    1. History of pulmonary embolism   2. Mixed dyslipidemia    PLAN:    In order of problems listed above:  1. Primary prevention stressed with the patient.  Importance of compliance with diet medication stressed and he vocalized understanding. 2. History of pulmonary embolism: Patient is on anticoagulation and takes it meticulously.  He will have an echocardiogram to assess murmur heard on auscultation. 3. Cardiac murmur: As mentioned above 4. Patient request complete blood work and we will do this today including lipids for restratification.  Also in view of his tingling and numbness issues I will do a B12 and vitamin D level and send it to his primary care physician. 5. Patient will be seen in follow-up appointment in 6 months or earlier if the patient has any concerns    Medication Adjustments/Labs and Tests Ordered: Current medicines are reviewed at length with the patient today.  Concerns regarding medicines are outlined above.  No orders of the defined types were placed in this encounter.  No orders of the defined types were placed in this encounter.    Chief Complaint  Patient presents with  . Follow-up    5 Months     History of Present Illness:    URIYAH Archer is a 49 y.o. male.  Patient has past medical history of pulmonary embolism.  He denies any problems at this time.  No chest pain orthopnea or PND.  He has some tingling and numbness.  He occasionally complains of shortness of breath.  Overall he is a very active gentleman.  He golfed yesterday half a day without any symptoms walking on the golf course.  At the time of my evaluation, the patient is alert awake oriented and in no distress.  Past Medical History:  Diagnosis Date  . DVT (deep venous  thrombosis) (HCC)   . ED (erectile dysfunction)   . History of pulmonary embolus (PE)   . Hypogonadism male   . Paresthesia     Past Surgical History:  Procedure Laterality Date  . MENISECTOMY  2006    Current Medications: Current Meds  Medication Sig  . apixaban (ELIQUIS) 5 MG TABS tablet Take 1 tablet (5 mg total) by mouth 2 (two) times daily.  . cetirizine (ZYRTEC) 10 MG tablet Take 10 mg by mouth daily.  . nitroGLYCERIN (NITROSTAT) 0.4 MG SL tablet Place 1 tablet (0.4 mg total) under the tongue every 5 (five) minutes as needed.     Allergies:   Vicodin [hydrocodone-acetaminophen]   Social History   Socioeconomic History  . Marital status: Single    Spouse name: Not on file  . Number of children: 3  . Years of education: college  . Highest education level: Not on file  Occupational History  . Occupation: Airline pilot  Tobacco Use  . Smoking status: Never Smoker  . Smokeless tobacco: Never Used  Substance and Sexual Activity  . Alcohol use: Yes    Comment: rarely  . Drug use: Yes    Types: Marijuana    Comment: small amount daily  . Sexual activity: Not on file  Other Topics Concern  . Not on file  Social History Narrative   Lives at home with his  son.   Left-sided.   1-2 cups of Pepsi per day.   Social Determinants of Health   Financial Resource Strain:   . Difficulty of Paying Living Expenses:   Food Insecurity:   . Worried About Charity fundraiser in the Last Year:   . Arboriculturist in the Last Year:   Transportation Needs:   . Film/video editor (Medical):   Marland Kitchen Lack of Transportation (Non-Medical):   Physical Activity:   . Days of Exercise per Week:   . Minutes of Exercise per Session:   Stress:   . Feeling of Stress :   Social Connections:   . Frequency of Communication with Friends and Family:   . Frequency of Social Gatherings with Friends and Family:   . Attends Religious Services:   . Active Member of Clubs or Organizations:   . Attends  Archivist Meetings:   Marland Kitchen Marital Status:      Family History: The patient's family history includes Clotting disorder in his father, maternal grandmother, and paternal grandmother; Heart attack in his father, mother, and sister; Heart disease in his father, mother, and sister.  ROS:   Please see the history of present illness.    All other systems reviewed and are negative.  EKGs/Labs/Other Studies Reviewed:    The following studies were reviewed today: I discussed my findings with the patient at length.   Recent Labs: 11/28/2018: Hemoglobin 15.4; Platelet Count 212  Recent Lipid Panel    Component Value Date/Time   CHOL 196 01/07/2018 1453   TRIG 123 01/07/2018 1453   HDL 48 01/07/2018 1453   CHOLHDL 4.1 01/07/2018 1453   CHOLHDL 3.6 01/12/2015 0001   VLDL 13 01/12/2015 0001   LDLCALC 123 (H) 01/07/2018 1453    Physical Exam:    VS:  BP 112/80   Pulse (!) 56   Ht 5\' 10"  (1.778 m)   Wt 188 lb (85.3 kg)   SpO2 96%   BMI 26.98 kg/m     Wt Readings from Last 3 Encounters:  11/24/19 188 lb (85.3 kg)  11/03/19 186 lb 11.2 oz (84.7 kg)  07/22/19 188 lb (85.3 kg)     GEN: Patient is in no acute distress HEENT: Normal NECK: No JVD; No carotid bruits LYMPHATICS: No lymphadenopathy CARDIAC: Hear sounds regular, 2/6 systolic murmur at the apex. RESPIRATORY:  Clear to auscultation without rales, wheezing or rhonchi  ABDOMEN: Soft, non-tender, non-distended MUSCULOSKELETAL:  No edema; No deformity  SKIN: Warm and dry NEUROLOGIC:  Alert and oriented x 3 PSYCHIATRIC:  Normal affect   Signed, Jenean Lindau, MD  11/24/2019 8:49 AM    [place]

## 2019-11-25 ENCOUNTER — Telehealth: Payer: Self-pay

## 2019-11-25 LAB — BASIC METABOLIC PANEL
BUN/Creatinine Ratio: 10 (ref 9–20)
BUN: 13 mg/dL (ref 6–24)
CO2: 23 mmol/L (ref 20–29)
Calcium: 9.5 mg/dL (ref 8.7–10.2)
Chloride: 103 mmol/L (ref 96–106)
Creatinine, Ser: 1.26 mg/dL (ref 0.76–1.27)
GFR calc Af Amer: 77 mL/min/{1.73_m2} (ref >59)
GFR calc non Af Amer: 67 mL/min/{1.73_m2} (ref >59)
Glucose: 105 mg/dL — ABNORMAL HIGH (ref 65–99)
Potassium: 4.6 mmol/L (ref 3.5–5.2)
Sodium: 142 mmol/L (ref 134–144)

## 2019-11-25 LAB — CBC WITH DIFFERENTIAL/PLATELET
Basophils Absolute: 0 10*3/uL (ref 0.0–0.2)
Basos: 0 %
EOS (ABSOLUTE): 0.1 10*3/uL (ref 0.0–0.4)
Eos: 1 %
Hematocrit: 43.7 % (ref 37.5–51.0)
Hemoglobin: 14.7 g/dL (ref 13.0–17.7)
Immature Grans (Abs): 0 10*3/uL (ref 0.0–0.1)
Immature Granulocytes: 0 %
Lymphocytes Absolute: 1.9 10*3/uL (ref 0.7–3.1)
Lymphs: 37 %
MCH: 30.2 pg (ref 26.6–33.0)
MCHC: 33.6 g/dL (ref 31.5–35.7)
MCV: 90 fL (ref 79–97)
Monocytes Absolute: 0.4 10*3/uL (ref 0.1–0.9)
Monocytes: 8 %
Neutrophils Absolute: 2.7 10*3/uL (ref 1.4–7.0)
Neutrophils: 54 %
Platelets: 219 10*3/uL (ref 150–450)
RBC: 4.87 x10E6/uL (ref 4.14–5.80)
RDW: 13.6 % (ref 11.6–15.4)
WBC: 5.1 10*3/uL (ref 3.4–10.8)

## 2019-11-25 LAB — HEPATIC FUNCTION PANEL
ALT: 15 IU/L (ref 0–44)
AST: 18 IU/L (ref 0–40)
Albumin: 4.6 g/dL (ref 4.0–5.0)
Alkaline Phosphatase: 69 IU/L (ref 39–117)
Bilirubin Total: 0.3 mg/dL (ref 0.0–1.2)
Bilirubin, Direct: 0.08 mg/dL (ref 0.00–0.40)
Total Protein: 6.9 g/dL (ref 6.0–8.5)

## 2019-11-25 LAB — VITAMIN B12: Vitamin B-12: 144 pg/mL — ABNORMAL LOW (ref 232–1245)

## 2019-11-25 LAB — LIPID PANEL
Chol/HDL Ratio: 4.1 ratio (ref 0.0–5.0)
Cholesterol, Total: 173 mg/dL (ref 100–199)
HDL: 42 mg/dL (ref >39)
LDL Chol Calc (NIH): 113 mg/dL — ABNORMAL HIGH (ref 0–99)
Triglycerides: 99 mg/dL (ref 0–149)
VLDL Cholesterol Cal: 18 mg/dL (ref 5–40)

## 2019-11-25 LAB — VITAMIN D 25 HYDROXY (VIT D DEFICIENCY, FRACTURES): Vit D, 25-Hydroxy: 8.1 ng/mL — ABNORMAL LOW (ref 30.0–100.0)

## 2019-11-25 LAB — TSH: TSH: 1.89 u[IU]/mL (ref 0.450–4.500)

## 2019-11-25 NOTE — Telephone Encounter (Signed)
-----   Message from Garwin Brothers, MD sent at 11/25/2019  8:17 AM EDT ----- Overall labs are fine but vitamin B12 and D levels are fairly low and he needs to call his primary care physician for treatment.  Copy primary care. Garwin Brothers, MD 11/25/2019 8:17 AM

## 2019-11-25 NOTE — Telephone Encounter (Signed)
Spoke with patient regarding results and recommendation.  Patient verbalizes understanding and is agreeable to plan of care. Advised patient to call back with any issues or concerns.  

## 2019-11-26 NOTE — Addendum Note (Signed)
Addended by: Eleonore Chiquito on: 11/26/2019 09:23 AM   Modules accepted: Orders

## 2019-12-02 ENCOUNTER — Other Ambulatory Visit: Payer: Self-pay

## 2019-12-02 ENCOUNTER — Ambulatory Visit (HOSPITAL_BASED_OUTPATIENT_CLINIC_OR_DEPARTMENT_OTHER)
Admission: RE | Admit: 2019-12-02 | Discharge: 2019-12-02 | Disposition: A | Discharge status: Home / Self Care | Payer: 59 | Source: Ambulatory Visit | Attending: Cardiology | Admitting: Cardiology

## 2019-12-02 DIAGNOSIS — R011 Cardiac murmur, unspecified: Secondary | ICD-10-CM | POA: Diagnosis not present

## 2019-12-02 NOTE — Progress Notes (Signed)
  Echocardiogram 2D Echocardiogram has been performed.  Jonathan Archer 12/02/2019, 9:05 AM

## 2020-07-02 ENCOUNTER — Telehealth: Payer: Self-pay | Admitting: Cardiology

## 2020-07-02 NOTE — Telephone Encounter (Signed)
Spoke with pt and advised him that I will have a card at the front desk to pickup

## 2020-07-02 NOTE — Telephone Encounter (Signed)
Pt c/o medication issue:  1. Name of Medication: apixaban (ELIQUIS) 5 MG TABS tablet  2. How are you currently taking this medication (dosage and times per day)? 1 tablet by mouth two times daily  3. Are you having a reaction (difficulty breathing--STAT)? No   4. What is your medication issue? [place] is calling stating his copay card for his Eliquis has expired and he is wanting to know if he can come pick up another one so he can continue to get this medication at a discounted price. Please advise.

## 2020-07-07 NOTE — Telephone Encounter (Signed)
Patient called back in, he picked up the card on Monday and called to activate about 10 minutes ago and is being told the card is invalid. Card that he picked expires 07/23/21 and it's not working. He states he had an issue with this last year. Please call/advise   Patient only has enough doses to last through Friday morning

## 2020-07-07 NOTE — Telephone Encounter (Signed)
Spoke with patient and advised him that I have reached out to the rep for guidance on what to do for the issue of card being invalid.

## 2020-07-09 NOTE — Telephone Encounter (Signed)
Pt advised to call the eliquis line and speak with the rep.

## 2020-07-09 NOTE — Telephone Encounter (Signed)
Jonathan Archer is calling back due to never hearing back from [place] regarding his invalid co pay card. Pt states he is now out of medication. Please advise.

## 2020-08-12 ENCOUNTER — Other Ambulatory Visit: Payer: Self-pay | Admitting: Cardiology

## 2020-08-12 NOTE — Telephone Encounter (Signed)
Prescription refill request for Eliquis received. Indication:dvt/pe Last office visit:12/21 revankar Scr:1.26  5/21 Age: 50 Weight:85.3 kg  Prescription refilled

## 2020-08-13 DIAGNOSIS — E291 Testicular hypofunction: Secondary | ICD-10-CM | POA: Insufficient documentation

## 2020-08-13 DIAGNOSIS — Z86711 Personal history of pulmonary embolism: Secondary | ICD-10-CM | POA: Insufficient documentation

## 2020-08-13 DIAGNOSIS — N529 Male erectile dysfunction, unspecified: Secondary | ICD-10-CM | POA: Insufficient documentation

## 2020-08-16 ENCOUNTER — Ambulatory Visit: Payer: 59 | Admitting: Cardiology

## 2020-08-18 ENCOUNTER — Telehealth: Payer: Self-pay

## 2020-08-18 NOTE — Telephone Encounter (Signed)
PA done on CMM for Eliquis 5 mg. Key [id number].  Denied on CMM due to patient insurance preferring the patient to have a contraindication to Xarelto.  Explained this patient and offered him a yearly $10 month copay card.  He stated he already had one of these and this is how he got his medication last month but the pharmacy states it will not work anymore.  I told the patient to instruct the pharmacy to run the card through again and it should work or he could come and get another card per Suzie (Eliquis rep).  If the card doesn't work the second time patient will call to get another and we will call Suzie to call the pharmacy.

## 2020-08-19 ENCOUNTER — Other Ambulatory Visit: Payer: Self-pay | Admitting: Cardiology

## 2020-08-20 ENCOUNTER — Other Ambulatory Visit: Payer: Self-pay

## 2020-08-20 MED ORDER — APIXABAN 5 MG PO TABS: ORAL_TABLET | ORAL | 2 refills | Status: DC

## 2020-08-23 ENCOUNTER — Telehealth: Payer: Self-pay | Admitting: Cardiology

## 2020-08-23 NOTE — Telephone Encounter (Signed)
Patient calling. He states he took the copay card and was told it was not working. He states the prior Berkley Harvey was denied, because his insurance needed a reason he could not take a cheaper blood thinner. He states he is fine with taking a cheaper medication if Dr. Tomie China is okay with that. Please advise.

## 2020-08-23 NOTE — Telephone Encounter (Signed)
Spoke with the pharmacy and let them know the PA was denied for the Eliquis.  But patient had a copay card to be used.  She informed me he just picked his medication up on 08-20-20 and only paid cash for the medication with his copay card.  Pharmacy staff member informed me he will have to take the copay card with him every time in order to receive the Eliquis at a discounted price.

## 2020-08-23 NOTE — Telephone Encounter (Signed)
Pt states that he paid $68.00 for 6 pills and that his co-pay card is not working. Suzi with Eliquis is calling the pharmacy for assistance with the card.

## 2020-08-23 NOTE — Telephone Encounter (Signed)
Pharmacy was calling to see if the office has completed a prior authorization for the patient's Eliquis. [facility] states they have faxed the request to the office several times but has not gotten a response.

## 2020-08-24 NOTE — Telephone Encounter (Signed)
Pt was able to pick up his medication for 10.00 without any problems.

## 2020-09-16 ENCOUNTER — Other Ambulatory Visit: Payer: Self-pay

## 2020-09-16 ENCOUNTER — Ambulatory Visit (INDEPENDENT_AMBULATORY_CARE_PROVIDER_SITE_OTHER): Payer: 59 | Admitting: Cardiology

## 2020-09-16 ENCOUNTER — Encounter: Payer: Self-pay | Admitting: Cardiology

## 2020-09-16 VITALS — BP 118/78 | HR 61 | Ht 70.0 in | Wt 193.0 lb

## 2020-09-16 DIAGNOSIS — E782 Mixed hyperlipidemia: Secondary | ICD-10-CM

## 2020-09-16 DIAGNOSIS — Z86711 Personal history of pulmonary embolism: Secondary | ICD-10-CM | POA: Diagnosis not present

## 2020-09-16 NOTE — Progress Notes (Signed)
Cardiology Office Note:    Date:  09/16/2020   ID:  Jonathan Archer, DOB 11/01/1970, MRN [id number]  PCP:  Ronnald Nian, MD  Cardiologist:  Garwin Brothers, MD   Referring MD: Ronnald Nian, MD    ASSESSMENT:    1. Mixed dyslipidemia   2. History of pulmonary embolism    PLAN:    In order of problems listed above:  1. Primary prevention stressed with the patient.  Importance of compliance with diet medication stressed any vocalized understanding. 2. History of pulmonary embolism: On anticoagulation.  Benefits and potential is explained any vocalized understanding.  Questions were answered to satisfaction. 3. Mixed dyslipidemia: Diet was emphasized.  Lipids were reviewed and he is fasting will have blood work today including lipids and I will forwarded to his primary care physician also. 4. He plays soccer and I told him about aerobic exercise and he promises to do this on a regular basis. 5. Patient will be seen in follow-up appointment in 6 months or earlier if the patient has any concerns    Medication Adjustments/Labs and Tests Ordered: Current medicines are reviewed at length with the patient today.  Concerns regarding medicines are outlined above.  No orders of the defined types were placed in this encounter.  No orders of the defined types were placed in this encounter.    No chief complaint on file.    History of Present Illness:    Jonathan Archer is a 50 y.o. male.  Patient has past medical history of pulmonary embolism and mild dyslipidemia.  He denies any problems at this time and takes care of activities of daily living.  No chest pain orthopnea or PND.  He is playing soccer on a regular basis now.  He denies any symptoms with this.  He is here for routine follow-up.  At the time of my evaluation, the patient is alert awake oriented and in no distress.  Past Medical History:  Diagnosis Date  . Allergic rhinitis due to pollen 01/12/2015  . Angina pectoris  (HCC) 06/27/2018  . Arthritis of hip 09/14/2011  . Atypical chest pain 06/24/2018  . DVT (deep venous thrombosis) (HCC)   . ED (erectile dysfunction)   . Family history of heart disease in male family member before age 50 04/12/2018  . History of pulmonary embolism 06/24/2018  . History of pulmonary embolus (PE)   . Hypercoagulability due to atrial fibrillation (HCC) 05/08/2018  . Hypogonadism male   . 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 Mixed dyslipidemia 04/24/2018  . Pain in left knee 10/17/2017  . Paresthesia   . Pre-operative cardiovascular examination 04/24/2018  . Pulmonary embolism (HCC) 05/08/2018  . 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    Past Surgical History:  Procedure Laterality Date  . MENISECTOMY  2006    Current Medications: Current Meds  Medication Sig  . apixaban (ELIQUIS) 5 MG TABS tablet TAKE 1 TABLET(5 MG) BY MOUTH TWICE DAILY  . cetirizine (ZYRTEC) 10 MG tablet Take 10 mg by mouth daily.  . Multiple Vitamin (MULTIVITAMIN) tablet Take 1 tablet by mouth daily.  . nitroGLYCERIN (NITROSTAT) 0.4 MG SL tablet Place 0.4 mg under the tongue every 5 (five) minutes as needed for chest pain.  Marland Kitchen omeprazole (PRILOSEC) 20 MG capsule Take 20 mg by mouth daily.     Allergies:   Vicodin hp [hydrocodone-acetaminophen] and Vicodin [hydrocodone-acetaminophen]   Social History   Socioeconomic History  . Marital status: Single    Spouse name: Not  on file  . Number of children: 3  . Years of education: college  . Highest education level: Not on file  Occupational History  . Occupation: Airline pilot  Tobacco Use  . Smoking status: Never Smoker  . Smokeless tobacco: Never Used  Vaping Use  . Vaping Use: Never used  Substance and Sexual Activity  . Alcohol use: Yes    Comment: rarely  . Drug use: Yes    Types: Marijuana    Comment: small amount daily  . Sexual activity: Not on file  Other Topics Concern  . Not on file  Social History Narrative   Lives at home with his son.    Left-sided.   1-2 cups of Pepsi per day.   Social Determinants of Health   Financial Resource Strain: Not on file  Food Insecurity: Not on file  Transportation Needs: Not on file  Physical Activity: Not on file  Stress: Not on file  Social Connections: Not on file     Family History: The patient's family history includes Clotting disorder in his father, maternal grandmother, and paternal grandmother; Heart attack in his father, mother, and sister; Heart disease in his father, mother, and sister.  ROS:   Please see the history of present illness.    All other systems reviewed and are negative.  EKGs/Labs/Other Studies Reviewed:    The following studies were reviewed today: EKG reveals sinus rhythm nonspecific ST-T changes and pulmonary disease pattern.   Recent Labs: 11/24/2019: ALT 15; BUN 13; Creatinine, Ser 1.26; Hemoglobin 14.7; Platelets 219; Potassium 4.6; Sodium 142; TSH 1.890  Recent Lipid Panel    Component Value Date/Time   CHOL 173 11/24/2019 0859   TRIG 99 11/24/2019 0859   HDL 42 11/24/2019 0859   CHOLHDL 4.1 11/24/2019 0859   CHOLHDL 3.6 01/12/2015 0001   VLDL 13 01/12/2015 0001   LDLCALC 113 (H) 11/24/2019 0859    Physical Exam:    VS:  BP 118/78   Pulse 61   Ht 5\' 10"  (1.778 m)   Wt 193 lb 0.6 oz (87.6 kg)   SpO2 98%   BMI 27.70 kg/m     Wt Readings from Last 3 Encounters:  09/16/20 193 lb 0.6 oz (87.6 kg)  11/24/19 188 lb (85.3 kg)  11/03/19 186 lb 11.2 oz (84.7 kg)     GEN: Patient is in no acute distress HEENT: Normal NECK: No JVD; No carotid bruits LYMPHATICS: No lymphadenopathy CARDIAC: Hear sounds regular, 2/6 systolic murmur at the apex. RESPIRATORY:  Clear to auscultation without rales, wheezing or rhonchi  ABDOMEN: Soft, non-tender, non-distended MUSCULOSKELETAL:  No edema; No deformity  SKIN: Warm and dry NEUROLOGIC:  Alert and oriented x 3 PSYCHIATRIC:  Normal affect   Signed, 01/03/20, MD  09/16/2020 9:49 AM     [facility]

## 2020-09-16 NOTE — Patient Instructions (Signed)
Medication Instructions:  Your physician recommends that you continue on your current medications as directed. Please refer to the Current Medication list given to you today.  *If you need a refill on your cardiac medications before your next appointment, please call your pharmacy*   Lab Work: Your physician recommends that you return for lab work today: bmp,cbc, tsh, lft, lipid  If you have labs (blood work) drawn today and your tests are completely normal, you will receive your results only by: Marland Kitchen MyChart Message (if you have MyChart) OR . A paper copy in the mail If you have any lab test that is abnormal or we need to change your treatment, we will call you to review the results.   Testing/Procedures: None   Follow-Up: At [facility], you and your health needs are our priority.  As part of our continuing mission to provide you with exceptional heart care, we have created designated Provider Care Teams.  These Care Teams include your primary Cardiologist (physician) and Advanced Practice Providers (APPs -  Physician Assistants and Nurse Practitioners) who all work together to provide you with the care you need, when you need it.  We recommend signing up for the patient portal called "MyChart".  Sign up information is provided on this After Visit Summary.  MyChart is used to connect with patients for Virtual Visits (Telemedicine).  Patients are able to view lab/test results, encounter notes, upcoming appointments, etc.  Non-urgent messages can be sent to your provider as well.   To learn more about what you can do with MyChart, go to [email or web address].    Your next appointment:   6 month(s)  The format for your next appointment:   In Person  Provider:   Belva Crome, MD   Other Instructions

## 2020-09-17 LAB — CBC
Hematocrit: 42.8 % (ref 37.5–51.0)
Hemoglobin: 14.7 g/dL (ref 13.0–17.7)
MCH: 29.6 pg (ref 26.6–33.0)
MCHC: 34.3 g/dL (ref 31.5–35.7)
MCV: 86 fL (ref 79–97)
Platelets: 254 10*3/uL (ref 150–450)
RBC: 4.96 x10E6/uL (ref 4.14–5.80)
RDW: 12.8 % (ref 11.6–15.4)
WBC: 4.9 10*3/uL (ref 3.4–10.8)

## 2020-09-17 LAB — HEPATIC FUNCTION PANEL
ALT: 20 IU/L (ref 0–44)
AST: 24 IU/L (ref 0–40)
Albumin: 4.5 g/dL (ref 4.0–5.0)
Alkaline Phosphatase: 80 IU/L (ref 44–121)
Bilirubin Total: 0.4 mg/dL (ref 0.0–1.2)
Bilirubin, Direct: 0.12 mg/dL (ref 0.00–0.40)
Total Protein: 6.8 g/dL (ref 6.0–8.5)

## 2020-09-17 LAB — LIPID PANEL
Chol/HDL Ratio: 4.3 ratio (ref 0.0–5.0)
Cholesterol, Total: 189 mg/dL (ref 100–199)
HDL: 44 mg/dL (ref >39)
LDL Chol Calc (NIH): 132 mg/dL — ABNORMAL HIGH (ref 0–99)
Triglycerides: 70 mg/dL (ref 0–149)
VLDL Cholesterol Cal: 13 mg/dL (ref 5–40)

## 2020-09-17 LAB — BASIC METABOLIC PANEL
BUN/Creatinine Ratio: 10 (ref 9–20)
BUN: 11 mg/dL (ref 6–24)
CO2: 21 mmol/L (ref 20–29)
Calcium: 9.3 mg/dL (ref 8.7–10.2)
Chloride: 103 mmol/L (ref 96–106)
Creatinine, Ser: 1.15 mg/dL (ref 0.76–1.27)
GFR calc Af Amer: 86 mL/min/{1.73_m2} (ref >59)
GFR calc non Af Amer: 74 mL/min/{1.73_m2} (ref >59)
Glucose: 102 mg/dL — ABNORMAL HIGH (ref 65–99)
Potassium: 4.8 mmol/L (ref 3.5–5.2)
Sodium: 139 mmol/L (ref 134–144)

## 2020-09-17 LAB — TSH: TSH: 1.97 u[IU]/mL (ref 0.450–4.500)

## 2020-10-06 ENCOUNTER — Telehealth: Payer: Self-pay

## 2020-10-06 NOTE — Telephone Encounter (Signed)
PA started on CMM for Eliquis 5 mg.  Key [id number]

## 2020-11-04 ENCOUNTER — Encounter: Payer: Self-pay | Admitting: Family Medicine

## 2020-11-04 ENCOUNTER — Ambulatory Visit (INDEPENDENT_AMBULATORY_CARE_PROVIDER_SITE_OTHER): Payer: 59 | Admitting: Family Medicine

## 2020-11-04 ENCOUNTER — Other Ambulatory Visit: Payer: Self-pay

## 2020-11-04 VITALS — BP 142/86 | HR 53 | Temp 97.1°F | Ht 70.0 in | Wt 193.4 lb

## 2020-11-04 DIAGNOSIS — E785 Hyperlipidemia, unspecified: Secondary | ICD-10-CM | POA: Insufficient documentation

## 2020-11-04 DIAGNOSIS — Z86711 Personal history of pulmonary embolism: Secondary | ICD-10-CM | POA: Diagnosis not present

## 2020-11-04 DIAGNOSIS — N3000 Acute cystitis without hematuria: Secondary | ICD-10-CM

## 2020-11-04 DIAGNOSIS — Z8249 Family history of ischemic heart disease and other diseases of the circulatory system: Secondary | ICD-10-CM | POA: Diagnosis not present

## 2020-11-04 DIAGNOSIS — Z23 Encounter for immunization: Secondary | ICD-10-CM | POA: Diagnosis not present

## 2020-11-04 HISTORY — DX: Hyperlipidemia, unspecified: E78.5

## 2020-11-04 LAB — POCT URINALYSIS DIP (PROADVANTAGE DEVICE)
Bilirubin, UA: NEGATIVE
Glucose, UA: NEGATIVE mg/dL
Ketones, POC UA: NEGATIVE mg/dL
Leukocytes, UA: NEGATIVE
Nitrite, UA: NEGATIVE
Protein Ur, POC: NEGATIVE mg/dL
Specific Gravity, Urine: 1.015
Urobilinogen, Ur: 0.2
pH, UA: 7 (ref 5.0–8.0)

## 2020-11-04 MED ORDER — SULFAMETHOXAZOLE-TRIMETHOPRIM 800-160 MG PO TABS: 1.0000 | ORAL_TABLET | Freq: Two times a day (BID) | ORAL | 0 refills | Status: DC

## 2020-11-04 NOTE — Progress Notes (Signed)
   Subjective:    Patient ID: Jonathan Archer, male    DOB: 1971/07/11, 50 y.o.   MRN: [id number]  HPI He is here for consult concerning multiple issues.  He has a 9-month history of urinary urgency, frequency, incomplete emptying and intermittent left lower quadrant discomfort.  He did have one episode of blurred vision mainly on the right but this went away after about 4 hours.  No headache, numbness, tingling. He does have a history of PE x2 and presently is taking Eliquis regularly.  He did have a work-up by hematology which was apparently negative. He also has a positive family history for heart disease and recently did have blood work which showed an LDL of 132.  He is in the process of making diet and exercise changes.  He has seen cardiology about this.   Review of Systems     Objective:   Physical Exam Alert and in no distress.  Abdominal and genital exam is normal.  Rectal exam shows a normal nontender prostate.  Urine microscopic was negative for red and white cells.       Assessment & Plan:  Acute cystitis without hematuria - Plan: Urine Culture, sulfamethoxazole-trimethoprim (BACTRIM DS) 800-160 MG tablet  Immunization, viral disease - Plan: PFIZER Comirnaty(GRAY TOP)COVID-19 Vaccine  Family history of heart disease in male family member before age 61  History of pulmonary embolus (PE)  Hyperlipidemia, unspecified hyperlipidemia type His symptoms are consistent with a UTI and I will therefore treat this.  I will do a culture on this to be safe. I discussed the family history of heart disease and his lipid panel and agree with diet and exercise changes first.  Did discuss the use of a statin drug which will probably need to be added at some point in the future. He will continue on his Eliquis and understands the risks and benefits.

## 2020-11-05 LAB — URINE CULTURE: Organism ID, Bacteria: NO GROWTH

## 2020-11-24 ENCOUNTER — Telehealth: Payer: Self-pay

## 2020-11-24 NOTE — Telephone Encounter (Addendum)
[id number] Prior auth completed for Eliquis.. Determination pending.

## 2021-03-14 ENCOUNTER — Other Ambulatory Visit: Payer: Self-pay

## 2021-03-15 ENCOUNTER — Other Ambulatory Visit: Payer: Self-pay

## 2021-03-15 ENCOUNTER — Ambulatory Visit (INDEPENDENT_AMBULATORY_CARE_PROVIDER_SITE_OTHER): Payer: 59 | Admitting: Cardiology

## 2021-03-15 ENCOUNTER — Encounter: Payer: Self-pay | Admitting: Cardiology

## 2021-03-15 VITALS — BP 138/74 | HR 48 | Ht 70.0 in | Wt 188.0 lb

## 2021-03-15 DIAGNOSIS — Z86711 Personal history of pulmonary embolism: Secondary | ICD-10-CM

## 2021-03-15 DIAGNOSIS — E782 Mixed hyperlipidemia: Secondary | ICD-10-CM

## 2021-03-15 NOTE — Patient Instructions (Signed)

## 2021-03-15 NOTE — Progress Notes (Signed)
Cardiology Office Note:    Date:  03/15/2021   ID:  Jonathan Archer, DOB 11-Jun-1971, MRN [id number]  PCP:  Ronnald Nian, MD  Cardiologist:  Garwin Brothers, MD   Referring MD: Ronnald Nian, MD    ASSESSMENT:    1. History of pulmonary embolism   2. Mixed dyslipidemia    PLAN:    In order of problems listed above:  Primary prevention stressed with the patient.  Importance of compliance with diet medication stressed and vocalized understanding. History of pulmonary embolism on anticoagulation: He is with the regular with his medications and compliance was urged.  Benefits and risks of anticoagulation discussed and he vocalized understanding.  This is followed by primary care. Mixed dyslipidemia: Diet was emphasized and lipids reviewed and questions were answered to her satisfaction.  His calcium score is 0. Patient will be seen in follow-up appointment in 6 months or earlier if the patient has any concerns.    Medication Adjustments/Labs and Tests Ordered: Current medicines are reviewed at length with the patient today.  Concerns regarding medicines are outlined above.  No orders of the defined types were placed in this encounter.  No orders of the defined types were placed in this encounter.    No chief complaint on file.    History of Present Illness:    Jonathan Archer is a 50 y.o. male.  Patient has past medical history of pulmonary embolism.  He denies any problems at this time and takes care of activities of daily living.  No chest pain orthopnea or PND.  He denies any palpitations or any such issues.  He is on anticoagulation consistently and takes his medications regularly.  At the time of my evaluation, the patient is alert awake oriented and in no distress.  Past Medical History:  Diagnosis Date   Allergic rhinitis due to pollen 01/12/2015   Angina pectoris (HCC) 06/27/2018   Arthritis of hip 09/14/2011   Atypical chest pain 06/24/2018   DVT (deep venous  thrombosis) [facility])    ED (erectile dysfunction)    Family history of heart disease in male family member before age 9 04/12/2018   History of pulmonary embolism 06/24/2018   History of pulmonary embolus (PE)    Hypercoagulability due to atrial fibrillation (HCC) 05/08/2018   Hyperlipidemia 11/04/2020   Hypogonadism male    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/12/2018   Mixed dyslipidemia 04/24/2018   Pain in left knee 10/17/2017   Paresthesia    Pre-operative cardiovascular examination 04/24/2018   Pulmonary embolism (HCC) 05/08/2018   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    Past Surgical History:  Procedure Laterality Date   MENISECTOMY  2006    Current Medications: Current Meds  Medication Sig   apixaban (ELIQUIS) 5 MG TABS tablet TAKE 1 TABLET(5 MG) BY MOUTH TWICE DAILY   cetirizine (ZYRTEC) 10 MG tablet Take 10 mg by mouth daily.   Multiple Vitamin (MULTIVITAMIN) tablet Take 1 tablet by mouth daily.   nitroGLYCERIN (NITROSTAT) 0.4 MG SL tablet Place 0.4 mg under the tongue every 5 (five) minutes as needed for chest pain.   omeprazole (PRILOSEC) 20 MG capsule Take 20 mg by mouth daily.     Allergies:   Vicodin hp [hydrocodone-acetaminophen] and Vicodin [hydrocodone-acetaminophen]   Social History   Socioeconomic History   Marital status: Single    Spouse name: Not on file   Number of children: 3   Years of education: college   Highest education level: Not on file  Occupational History   Occupation: Airline pilot  Tobacco Use   Smoking status: Never   Smokeless tobacco: Never  Vaping Use   Vaping Use: Never used  Substance and Sexual Activity   Alcohol use: Yes    Comment: rarely   Drug use: Yes    Types: Marijuana    Comment: small amount daily   Sexual activity: Not on file  Other Topics Concern   Not on file  Social History Narrative   Lives at home with his son.   Left-sided.   1-2 cups of Pepsi per day.   Social Determinants of Health   Financial Resource Strain: Not on  file  Food Insecurity: Not on file  Transportation Needs: Not on file  Physical Activity: Not on file  Stress: Not on file  Social Connections: Not on file     Family History: The patient's family history includes Clotting disorder in his father, maternal grandmother, and paternal grandmother; Heart attack in his father, mother, and sister; Heart disease in his father, mother, and sister.  ROS:   Please see the history of present illness.    All other systems reviewed and are negative.  EKGs/Labs/Other Studies Reviewed:    The following studies were reviewed today: I discussed my findings with the patient at length   Recent Labs: 09/16/2020: ALT 20; BUN 11; Creatinine, Ser 1.15; Hemoglobin 14.7; Platelets 254; Potassium 4.8; Sodium 139; TSH 1.970  Recent Lipid Panel    Component Value Date/Time   CHOL 189 09/16/2020 0955   TRIG 70 09/16/2020 0955   HDL 44 09/16/2020 0955   CHOLHDL 4.3 09/16/2020 0955   CHOLHDL 3.6 01/12/2015 0001   VLDL 13 01/12/2015 0001   LDLCALC 132 (H) 09/16/2020 0955    Physical Exam:    VS:  BP 138/74   Pulse (!) 48   Ht 5\' 10"  (1.778 m)   Wt 188 lb (85.3 kg)   BMI 26.98 kg/m     Wt Readings from Last 3 Encounters:  03/15/21 188 lb (85.3 kg)  11/04/20 193 lb 6.4 oz (87.7 kg)  09/16/20 193 lb 0.6 oz (87.6 kg)     GEN: Patient is in no acute distress HEENT: Normal NECK: No JVD; No carotid bruits LYMPHATICS: No lymphadenopathy CARDIAC: Hear sounds regular, 2/6 systolic murmur at the apex. RESPIRATORY:  Clear to auscultation without rales, wheezing or rhonchi  ABDOMEN: Soft, non-tender, non-distended MUSCULOSKELETAL:  No edema; No deformity  SKIN: Warm and dry NEUROLOGIC:  Alert and oriented x 3 PSYCHIATRIC:  Normal affect   Signed, 09/18/20, MD  03/15/2021 5:01 PM    [facility]

## 2021-03-17 ENCOUNTER — Ambulatory Visit: Payer: 59 | Admitting: Cardiology

## 2021-05-03 ENCOUNTER — Other Ambulatory Visit: Payer: Self-pay | Admitting: Cardiology

## 2021-05-03 NOTE — Telephone Encounter (Signed)
Prescription refill request for Eliquis received. Indication:dvt  Last office visit:revankar 03/15/21 Scr:1.15 09/16/20 Age: 56m Weight:87.7kg

## 2021-05-04 ENCOUNTER — Other Ambulatory Visit: Payer: Self-pay

## 2021-05-04 ENCOUNTER — Telehealth (INDEPENDENT_AMBULATORY_CARE_PROVIDER_SITE_OTHER): Payer: 59 | Admitting: Medical

## 2021-05-04 ENCOUNTER — Encounter: Payer: Self-pay | Admitting: Medical

## 2021-05-04 VITALS — BP 138/92 | HR 48 | Wt 190.0 lb

## 2021-05-04 DIAGNOSIS — J988 Other specified respiratory disorders: Secondary | ICD-10-CM

## 2021-05-04 DIAGNOSIS — R059 Cough, unspecified: Secondary | ICD-10-CM | POA: Diagnosis not present

## 2021-05-04 NOTE — Progress Notes (Signed)
Subjective:     Patient ID: Jonathan Archer, male   DOB: Feb 08, 1971, 50 y.o.   MRN: [id number]  This visit type was conducted due to national recommendations for restrictions regarding the COVID-19 Pandemic (e.g. social distancing) in an effort to limit this patient's exposure and mitigate transmission in our community.  Due to their co-morbid illnesses, this patient is at least at moderate risk for complications without adequate follow up.  This format is felt to be most appropriate for this patient at this time.    Documentation for virtual audio and video telecommunications through [facility] encounter:  The patient was located at home. The provider was located in the office. The patient did consent to this visit and is aware of possible charges through their insurance for this visit.  The other persons participating in this telemedicine service were none. Time spent on call was 20 minutes and in review of previous records 20 minutes total.  This virtual service is not related to other E/M service within previous 7 days.   HPI Chief Complaint  Patient presents with   Nasal Congestion    Congestion, cough, headache.runny nose. Woke up Sunday with runny nose and continued. Head cold. Negative covid yesterday.    Virtual consult for illness.  Symptoms began 3 days ago.  He feels like he has a head cold.  He notes headache, stuffy head, runny nose, congestion, some cough, occasional chill.  Otherwise does not feel all that bad and actually feels better today than yesterday.  No fever.  No nausea vomiting or diarrhea, no sore throat.  No shortness of breath.  He had COVID a year ago and he has not really regained full sense of smell since a year ago, but no new change in smell or taste.  His wife and children who have all been sick with the same symptoms except they are worse.  They have horrible coughs.  Everyone in the house has had negative COVID test and his son and wife tested negative as  well this week.  Slyvester tested negative yesterday for COVID with a home test.  He is a non-smoker.  No history of asthma.  He does smoke marijuana some.  Past Medical History:  Diagnosis Date   Allergic rhinitis due to pollen 01/12/2015   Angina pectoris (HCC) 06/27/2018   Arthritis of hip 09/14/2011   Atypical chest pain 06/24/2018   DVT (deep venous thrombosis) [facility])    ED (erectile dysfunction)    Family history of heart disease in male family member before age 50 04/12/2018   History of pulmonary embolism 06/24/2018   History of pulmonary embolus (PE)    Hypercoagulability due to atrial fibrillation (HCC) 05/08/2018   Hyperlipidemia 11/04/2020   Hypogonadism male    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/12/2018   Mixed dyslipidemia 04/24/2018   Pain in left knee 10/17/2017   Paresthesia    Pre-operative cardiovascular examination 04/24/2018   Pulmonary embolism (HCC) 05/08/2018   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   Current Outpatient Medications on File Prior to Visit  Medication Sig Dispense Refill   apixaban (ELIQUIS) 5 MG TABS tablet TAKE 1 TABLET BY MOUTH TWICE DAILY 180 tablet 1   cetirizine (ZYRTEC) 10 MG tablet Take 10 mg by mouth daily.     Multiple Vitamin (MULTIVITAMIN) tablet Take 1 tablet by mouth daily.     nitroGLYCERIN (NITROSTAT) 0.4 MG SL tablet Place 0.4 mg under the tongue every 5 (five) minutes as needed for chest pain.  omeprazole (PRILOSEC) 20 MG capsule Take 20 mg by mouth daily.     No current facility-administered medications on file prior to visit.     Review of Systems As in subjective    Objective:   Physical Exam BP (!) 138/92   Pulse (!) 48 Comment: normal for him  Wt 190 lb (86.2 kg)   BMI 27.26 kg/m   Due to coronavirus pandemic stay at home measures, patient visit was virtual and they were not examined in person.   General: Well-developed well-nourished no acute distress,, well appearing No labored breathing, no wheezing, no dyspnea      Assessment:     Encounter Diagnoses  Name Primary?   Respiratory tract infection Yes   Cough, unspecified type        Plan:     We discussed the symptoms to suggest viral respiratory tract infection with mild symptoms compared to his family members which are worse off.  We discussed importance of hydration, rest, Tylenol for pain, can use Mucinex DM over-the-counter or Robitussin-DM for cough and congestion.  We discussed extra vitamin pack such as emergenC immune plus.    We discussed possibly coming in for COVID PCR test.  Given that he is around day 3, and mild symptoms, he chooses to quarantine and wait out symptoms at this point as he feels like he is moving in the right direction.  We discussed a period of quarantine, we discussed timeframe to see improvements.  If much worse or new symptoms in the next 2 days then call or recheck  Eileen was seen today for nasal congestion.  Diagnoses and all orders for this visit:  Respiratory tract infection  Cough, unspecified type  F/u prn

## 2021-05-04 NOTE — Patient Instructions (Signed)
Using essential oils for loss of smell and/or taste.   There are numerous articles in the news about re-training you nose with essential oils.   This is suggested approach from Dr. Aaron Robinson out of [place], [place].    It is not entirely clear why COVID-19 causes loss of sense of smell, but its likely because viruses, like the coronavirus, can damage nerves and COVID-19 enters and lives inside the nose.   One study cited over 86% of patients with mild to moderate covid symptoms lose smell or taste.  To help retrain the nose, here is a recommended approach.   . Purchase four essential oils; clove, eucalyptus, rose and lemon.  . Take the oils one at a time and put them on a cotton ball then breathe in the scent. . Focus for about 30 seconds and take deep breaths and just think about the scent . Repeat this three times a day for three to four weeks. 

## 2021-06-28 DIAGNOSIS — M20012 Mallet finger of left finger(s): Secondary | ICD-10-CM

## 2021-06-28 HISTORY — DX: Mallet finger of left finger(s): M20.012

## 2021-08-23 ENCOUNTER — Telehealth: Payer: Self-pay | Admitting: Cardiology

## 2021-08-23 NOTE — Telephone Encounter (Signed)
Patient calling in bout getting a new copay card from our office. Patient stats the card will help with his medication purchase for Eliquis. Please advise

## 2021-08-23 NOTE — Telephone Encounter (Signed)
Left VM to call back 

## 2021-08-23 NOTE — Telephone Encounter (Signed)
Spoke with pt and he will go by the [employer] office to pick up a copay card.

## 2021-09-12 DIAGNOSIS — M2410 Other articular cartilage disorders, unspecified site: Secondary | ICD-10-CM

## 2021-09-12 HISTORY — DX: Other articular cartilage disorders, unspecified site: M24.10

## 2021-09-21 ENCOUNTER — Ambulatory Visit: Payer: 59 | Admitting: Cardiology

## 2021-09-21 ENCOUNTER — Telehealth: Payer: Self-pay | Admitting: Family Medicine

## 2021-09-21 NOTE — Telephone Encounter (Signed)
Call pt concerning recent [facility] er visit. 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 He states he has 5 dissolvable stitches in his lip. He also states he has a gash above his lip.  Pt was advised he might need follow up and he states that he will keep an eye out and will call for an appt if needed.   ?

## 2021-09-22 ENCOUNTER — Other Ambulatory Visit: Payer: Self-pay

## 2021-09-22 ENCOUNTER — Ambulatory Visit (INDEPENDENT_AMBULATORY_CARE_PROVIDER_SITE_OTHER): Payer: PRIVATE HEALTH INSURANCE | Admitting: Medical

## 2021-09-22 VITALS — BP 120/80 | HR 56 | Temp 97.3°F | Wt 192.2 lb

## 2021-09-22 DIAGNOSIS — R58 Hemorrhage, not elsewhere classified: Secondary | ICD-10-CM | POA: Diagnosis not present

## 2021-09-22 DIAGNOSIS — Z7901 Long term (current) use of anticoagulants: Secondary | ICD-10-CM

## 2021-09-22 DIAGNOSIS — S01511D Laceration without foreign body of lip, subsequent encounter: Secondary | ICD-10-CM

## 2021-09-22 DIAGNOSIS — Z23 Encounter for immunization: Secondary | ICD-10-CM | POA: Diagnosis not present

## 2021-09-22 DIAGNOSIS — W540XXD Bitten by dog, subsequent encounter: Secondary | ICD-10-CM | POA: Diagnosis not present

## 2021-09-22 HISTORY — DX: Long term (current) use of anticoagulants: Z79.01

## 2021-09-22 NOTE — Progress Notes (Signed)
Subjective: ? Jonathan Archer is a 51 y.o. male who presents for ?Chief Complaint  ?Patient presents with  ? Animal Bite  ?  Bite by pit bull on face. Still bleeding when talking or eating.  ?   ?Here for hospital f/u.   DOI 09/20/21.   Was seen at [facility] in [place] for dog bite to face.  He was seen and evaluated in the ED, had dissolvable sutures x 5 to the face place of hte right upper lip.  Patients thinks it was 5 separate sutures.   ? ?Here today as the wound seeps blood when he talks or eats.  He works in Airline pilot so talks a lot.  He is on chronic anticoagulation with eliquis.   ? ?No wound inside the mouth.  No fever, no pus drainage, no body aches or chills.   ? ?He was petting 2 dogs when one pit bull bit him in the face.  The other dog a [place] was very friendly.  The owner was present.  His tetanus booster was updated at the [facility].   ? ?No other aggravating or relieving factors.   ? ?No other c/o. ? ?The following portions of the patient's history were reviewed and updated as appropriate: allergies, current medications, past family history, past medical history, past social history, past surgical history and problem list. ? ?ROS ?Otherwise as in subjective above ? ?Objective: ?BP 120/80   Pulse (!) 56   Temp (!) 97.3 ?F (36.3 ?C)   Wt 192 lb 3.2 oz (87.2 kg)   BMI 27.58 kg/m?  ? ?General appearance: alert, no distress, well developed, well nourished ?Right upper lip with 3 wound, 2 small superficial abrasions and then 1 larger roughly 1 cm diameter wound that has been closed but has dried blood on the surface. No current seepage or bleeding.  ?No obvious wounds inside the mouth, no tooth abnormality ?He has mild swelling at the right upper lip wound but no warmth or erythema ? ? ?Assessment: ?Encounter Diagnoses  ?Name Primary?  ? Lip laceration, subsequent encounter Yes  ? Needs flu shot   ? Dog bite, subsequent encounter   ? Bleeding   ? Current use of long term  anticoagulation   ? ? ? ?Plan: ?I do not have access to the emergency department notes.  Currently there is some mild swelling at the location of the bite of the right lip there is some dried blood and crusting.  He is on anticoagulation which is aggravating the issue.  Advise he do pureed foods for now and liquids such as smoothies and drink from a straw until scabbing occurs.  Limit talking when possible.  We discussed bandaging as best he can.  He will consider looking at Tegaderm as another topical option. ? ?Hopefully the scab will form over the next 2 days which will help resolve the issue and continue to help healing.  At this point advised if any worsening swelling, worsening redness, pus drainage, fever or other worsening symptoms he may ultimately need to see wound care or plastic surgery ? ?So far the things seem to be improving ? ?He notes having tetanus booster updated at the hospital visit.  The pet owner reportedly said the dogs were up-to-date on their vaccines ? ?Counseled on the influenza virus vaccine.  Vaccine information sheet given.  Influenza vaccine given after consent obtained. ? ? ?Friend was seen today for animal bite. ? ?Diagnoses and all orders for this  visit: ? ?Lip laceration, subsequent encounter ? ?Needs flu shot ?-     Flu Vaccine QUAD 70mo+IM (Fluarix, Fluzone & Alfiuria Quad PF) ? ?Dog bite, subsequent encounter ? ?Bleeding ? ?Current use of long term anticoagulation ? ? ? ?Follow up: prn ?

## 2021-09-29 ENCOUNTER — Encounter: Payer: Self-pay | Admitting: Physician Assistant

## 2021-09-29 ENCOUNTER — Ambulatory Visit (INDEPENDENT_AMBULATORY_CARE_PROVIDER_SITE_OTHER): Payer: PRIVATE HEALTH INSURANCE | Admitting: Physician Assistant

## 2021-09-29 ENCOUNTER — Other Ambulatory Visit: Payer: Self-pay

## 2021-09-29 VITALS — BP 140/90 | HR 64 | Ht 70.0 in | Wt 191.2 lb

## 2021-09-29 DIAGNOSIS — Z7901 Long term (current) use of anticoagulants: Secondary | ICD-10-CM

## 2021-09-29 DIAGNOSIS — S01511D Laceration without foreign body of lip, subsequent encounter: Secondary | ICD-10-CM | POA: Diagnosis not present

## 2021-09-29 DIAGNOSIS — W540XXD Bitten by dog, subsequent encounter: Secondary | ICD-10-CM | POA: Diagnosis not present

## 2021-09-29 NOTE — Progress Notes (Signed)
Acute Office Visit  Subjective:    Patient ID: Jonathan Archer, male    DOB: Mar 24, 1971, 51 y.o.   MRN: [id number]  Chief Complaint  Patient presents with   Animal Bite    Follow up on dog bite on face. Pt is on blood thinners.pt is still having pain on the left side of his face. Pt now has headaches.     HPI Patient is in today for a follow up appointment; reports that the dog bite on his right upper lip is getting better, but is taking a long time to completely heal; had dissolvable sutures placed; states was having a lot of headaches that Tylenol helped, but they are getting better; states he and his Wife feel that he may have had a concussion from the impact of the pit bull knocking him down with the original injury; states his appetite is normal; denies nausea and vomiting; denies fever.  Also mentions that he anticipates that he will have to have knee surgery again, but has not yet been back to Orthopedics and doesn't have a surgery date scheduled.   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   Mixed dyslipidemia 04/24/2018   Pain in left knee 10/17/2017   Paresthesia    Pre-operative cardiovascular examination 04/24/2018   Pulmonary embolism (HCC) 05/08/2018   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    Past Surgical History:  Procedure Laterality Date   MENISECTOMY  2006    Family History  Problem Relation Age of Onset   Heart disease Sister    Heart attack Sister    Heart  disease Mother    Heart attack Mother    Heart disease Father    Heart attack Father    Clotting disorder Father    Clotting disorder Maternal Grandmother    Clotting disorder Paternal Grandmother     Social History   Socioeconomic History   Marital status: Single    Spouse name: Not on file   Number of children: 3   Years of education: college   Highest education level: Not on file  Occupational History   Occupation: Airline pilot  Tobacco Use   Smoking status: Never   Smokeless tobacco: Never  Vaping Use   Vaping Use: Never used  Substance and Sexual Activity   Alcohol use: Yes    Comment: rarely   Drug use: Yes    Types: Marijuana    Comment: small amount daily   Sexual activity: Not on file  Other Topics Concern   Not on file  Social History Narrative   Lives at home with his son.   Left-sided.   1-2 cups of Pepsi per day.   Social Determinants of Health   Financial Resource Strain: Not on file  Food Insecurity: Not on file  Transportation Needs: Not on file  Physical Activity: Not on file  Stress: Not on file  Social Connections: Not on file  Intimate Partner Violence: Not on file  Outpatient Medications Prior to Visit  Medication Sig Dispense Refill   apixaban (ELIQUIS) 5 MG TABS tablet TAKE 1 TABLET BY MOUTH TWICE DAILY 180 tablet 1   cetirizine (ZYRTEC) 10 MG tablet Take 10 mg by mouth daily.     Multiple Vitamin (MULTIVITAMIN) tablet Take 1 tablet by mouth daily.     nitroGLYCERIN (NITROSTAT) 0.4 MG SL tablet Place 0.4 mg under the tongue every 5 (five) minutes as needed for chest pain.     omeprazole (PRILOSEC) 20 MG capsule Take 20 mg by mouth daily.     amoxicillin-clavulanate (AUGMENTIN) 875-125 MG tablet SMARTSIG:1 Tablet(s) By Mouth Every 12 Hours     No facility-administered medications prior to visit.    Allergies  Allergen Reactions   Vicodin [Hydrocodone-Acetaminophen] Other (See Comments)    Eyes burn    Review of Systems   Constitutional:  Negative for activity change, chills, fatigue and fever.  HENT:  Positive for facial swelling. Negative for congestion, drooling, ear pain, hearing loss and voice change.   Eyes:  Negative for pain and redness.  Respiratory:  Negative for cough and shortness of breath.   Cardiovascular:  Negative for leg swelling.  Gastrointestinal:  Negative for constipation, diarrhea, nausea and vomiting.  Endocrine: Negative for polyuria.  Genitourinary:  Negative for flank pain and frequency.  Musculoskeletal:  Positive for arthralgias. Negative for joint swelling, neck pain and neck stiffness.  Skin:  Positive for wound. Negative for rash.  Neurological:  Positive for headaches. Negative for dizziness, facial asymmetry, speech difficulty and light-headedness.  Hematological:  Does not bruise/bleed easily.  Psychiatric/Behavioral:  Negative for agitation and behavioral problems.       Objective:    Physical Exam Vitals and nursing note reviewed.  Constitutional:      General: He is not in acute distress.    Appearance: Normal appearance.  HENT:     Head: Normocephalic and atraumatic.     Right Ear: External ear normal.     Left Ear: External ear normal.     Nose: No congestion.  Eyes:     Extraocular Movements: Extraocular movements intact.     Conjunctiva/sclera: Conjunctivae normal.     Pupils: Pupils are equal, round, and reactive to light.  Neck:     Comments: Minimal edema on right side of neck, no contusions or hematoma Cardiovascular:     Rate and Rhythm: Normal rate and regular rhythm.     Pulses: Normal pulses.     Heart sounds: Normal heart sounds.  Pulmonary:     Effort: Pulmonary effort is normal.     Breath sounds: Normal breath sounds. No wheezing.  Abdominal:     General: Bowel sounds are normal.     Palpations: Abdomen is soft.  Musculoskeletal:        General: Normal range of motion.     Cervical back: Normal range of motion and neck supple. Edema  present. No rigidity.     Right lower leg: No edema.     Left lower leg: No edema.  Skin:    General: Skin is warm and dry.     Findings: Laceration present. No rash.     Comments: Right upper lip laceration has scabbing, minimal erythema and edema, no active bleeding  Neurological:     Mental Status: He is alert and oriented to person, place, and time.     Gait: Gait normal.  Psychiatric:        Mood and Affect: Mood normal.  Behavior: Behavior normal.    BP 140/90    Pulse 64    Wt 191 lb 3.2 oz (86.7 kg)    SpO2 97%    BMI 27.43 kg/m   Wt Readings from Last 3 Encounters:  09/29/21 191 lb 3.2 oz (86.7 kg)  09/22/21 192 lb 3.2 oz (87.2 kg)  05/04/21 190 lb (86.2 kg)    Health Maintenance Due  Topic Date Due   HIV Screening  Never done   Hepatitis C Screening  Never done   COLONOSCOPY (Pts 45-33yrs Insurance coverage will need to be confirmed)  Never done   COVID-19 Vaccine (2 - Pfizer series) 11/25/2020   Zoster Vaccines- Shingrix (1 of 2) Never done    There are no preventive care reminders to display for this patient.   Lab Results  Component Value Date   TSH 1.970 09/16/2020   Lab Results  Component Value Date   WBC 4.9 09/16/2020   HGB 14.7 09/16/2020   HCT 42.8 09/16/2020   MCV 86 09/16/2020   PLT 254 09/16/2020   Lab Results  Component Value Date   NA 139 09/16/2020   K 4.8 09/16/2020   CO2 21 09/16/2020   GLUCOSE 102 (H) 09/16/2020   BUN 11 09/16/2020   CREATININE 1.15 09/16/2020   BILITOT 0.4 09/16/2020   ALKPHOS 80 09/16/2020   AST 24 09/16/2020   ALT 20 09/16/2020   PROT 6.8 09/16/2020   ALBUMIN 4.5 09/16/2020   CALCIUM 9.3 09/16/2020   ANIONGAP 8 06/26/2018   Lab Results  Component Value Date   CHOL 189 09/16/2020   Lab Results  Component Value Date   HDL 44 09/16/2020   Lab Results  Component Value Date   LDLCALC 132 (H) 09/16/2020   Lab Results  Component Value Date   TRIG 70 09/16/2020   Lab Results  Component  Value Date   CHOLHDL 4.3 09/16/2020   No results found for: HGBA1C     Assessment & Plan:   Problem List Items Addressed This Visit   None    No orders of the defined types were placed in this encounter.   Continue current management. Follow up with Dr. Redmond School for an annual exam.  Irene Pap, PA-C

## 2021-10-26 ENCOUNTER — Other Ambulatory Visit: Payer: Self-pay

## 2021-11-01 ENCOUNTER — Encounter: Payer: Self-pay | Admitting: Cardiology

## 2021-11-01 ENCOUNTER — Ambulatory Visit (INDEPENDENT_AMBULATORY_CARE_PROVIDER_SITE_OTHER): Payer: No Typology Code available for payment source | Admitting: Cardiology

## 2021-11-01 VITALS — BP 146/76 | HR 61 | Ht 70.6 in | Wt 196.1 lb

## 2021-11-01 DIAGNOSIS — Z86711 Personal history of pulmonary embolism: Secondary | ICD-10-CM

## 2021-11-01 DIAGNOSIS — R079 Chest pain, unspecified: Secondary | ICD-10-CM | POA: Insufficient documentation

## 2021-11-01 DIAGNOSIS — Z01810 Encounter for preprocedural cardiovascular examination: Secondary | ICD-10-CM

## 2021-11-01 NOTE — Progress Notes (Signed)
?Cardiology Office Note:   ? ?Date:  11/01/2021  ? ?ID:  Jonathan Archer, DOB 04/29/1971, MRN [id number] ? ?PCP:  Jonathan Archer  ?Cardiologist:  Jonathan Archer  ? ?Referring Archer: Jonathan Archer  ? ? ?ASSESSMENT:   ? ?1. Chest pain, unspecified type   ?2. Preop cardiovascular exam   ?3. History of pulmonary embolism   ?4. Pre-operative cardiovascular examination   ?5. Chest pain of uncertain etiology   ? ?PLAN:   ? ?In order of problems listed above: ? ?Primary prevention stressed with the patient.  Importance of compliance with diet and medication stressed any vocalized understanding. ?History of pulmonary embolism on anticoagulation and consistent and compliant with this. ?Preop cardiovascular evaluation: Patient has chest pain at times.  This is atypical but in view of risk factors we will do a Lexiscan sestamibi.  If this is negative then he is not at high risk for coronary events during the aforementioned surgery.  Meticulous hemodynamic monitoring will further reduce the risk of coronary events.  Patient plans to undergo knee surgery. ?Perioperative anticoagulation: In view of the fact that he is already pulmonary embolism x2 he is keen on keeping anticoagulation as uninterrupted as possible.  In view of this I told him that he could be switched to Lovenox bridging when he is off Eliquis.  He is agreeable.  Then the anticoagulation will have to be restarted per the comfort and judgment of his surgeon.  He understands.  He will get in touch with Jonathan Archer if he needs assistance with Lovenox patient. ?Patient will be seen in follow-up appointment in 9 months or earlier if the patient has any concerns ? ? ? ?Medication Adjustments/Labs and Tests Ordered: ?Current medicines are reviewed at length with the patient today.  Concerns regarding medicines are outlined above.  ?Orders Placed This Encounter  ?Procedures  ? MYOCARDIAL PERFUSION IMAGING  ? EKG 12-Lead  ? ?No orders of the defined types were placed in  this encounter. ? ? ? ?No chief complaint on file. ?  ? ?History of Present Illness:   ? ?Jonathan Archer is a 51 y.o. male.  Patient has past medical history of pulmonary embolism x2 and mixed dyslipidemia.  Denies any problems at this time and takes care of activities of daily living.  No chest pain orthopnea or PND.  He says he has some palpitation like feeling when he exerts himself.  Occasionally has chest discomfort not related to exertion.  No orthopnea or PND.  He takes medications on a regular basis including anticoagulation.  At the time of my evaluation, the patient is alert awake oriented and in no distress. ? ?Past Medical History:  ?Diagnosis Date  ? Allergic rhinitis due to pollen 01/12/2015  ? Angina pectoris (HCC) 06/27/2018  ? Arthritis of hip 09/14/2011  ? Atypical chest pain 06/24/2018  ? Current use of long term anticoagulation 09/22/2021  ? 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  ? DVT (deep venous thrombosis) (HCC)   ? ED (erectile dysfunction)   ? Family history of heart disease in male family member before age 51 04/12/2018  ? History of pulmonary embolism 06/24/2018  ? History of pulmonary embolus (PE)   ? Hypercoagulability due to atrial fibrillation (HCC) 05/08/2018  ? Hyperlipidemia 11/04/2020  ? Hypogonadism male   ? 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 Mallet finger of left hand 06/28/2021  ? Mixed dyslipidemia 04/24/2018  ? Pain in left knee 10/17/2017  ? Paresthesia   ?  Pre-operative cardiovascular examination 04/24/2018  ? Pulmonary embolism (HCC) 05/08/2018  ? 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  ? ? ?Past Surgical History:  ?Procedure Laterality Date  ? MENISECTOMY  2006  ? ? ?Current Medications: ?Current Meds  ?Medication Sig  ? apixaban (ELIQUIS) 5 MG TABS tablet TAKE 1 TABLET BY MOUTH TWICE DAILY  ? cetirizine (ZYRTEC) 10 MG tablet Take 10 mg by mouth daily.  ? Multiple Vitamin (MULTIVITAMIN) tablet Take 1 tablet by mouth daily.  ? nitroGLYCERIN (NITROSTAT) 0.4 MG SL tablet Place 0.4 mg under  the tongue every 5 (five) minutes as needed for chest pain.  ? omeprazole (PRILOSEC) 20 MG capsule Take 20 mg by mouth daily.  ?  ? ?Allergies:   Vicodin [hydrocodone-acetaminophen]  ? ?Social History  ? ?Socioeconomic History  ? Marital status: Single  ?  Spouse name: Not on file  ? Number of children: 3  ? Years of education: college  ? Highest education level: Not on file  ?Occupational History  ? Occupation: Airline pilot  ?Tobacco Use  ? Smoking status: Never  ? Smokeless tobacco: Never  ?Vaping Use  ? Vaping Use: Never used  ?Substance and Sexual Activity  ? Alcohol use: Yes  ?  Comment: rarely  ? Drug use: Yes  ?  Types: Marijuana  ?  Comment: small amount daily  ? Sexual activity: Not on file  ?Other Topics Concern  ? Not on file  ?Social History Narrative  ? Lives at home with his son.  ? Left-sided.  ? 1-2 cups of Pepsi per day.  ? ?Social Determinants of Health  ? ?Financial Resource Strain: Not on file  ?Food Insecurity: Not on file  ?Transportation Needs: Not on file  ?Physical Activity: Not on file  ?Stress: Not on file  ?Social Connections: Not on file  ?  ? ?Family History: ?The patient's family history includes Clotting disorder in his father, maternal grandmother, and paternal grandmother; Heart attack in his father, mother, and sister; Heart disease in his father, mother, and sister. ? ?ROS:   ?Please see the history of present illness.    ?All other systems reviewed and are negative. ? ?EKGs/Labs/Other Studies Reviewed:   ? ?The following studies were reviewed today: ?I discussed my findings with the patient at length. ? ? ?Recent Labs: ?No results found for requested labs within last 8760 hours.  ?Recent Lipid Panel ?   ?Component Value Date/Time  ? CHOL 189 09/16/2020 0955  ? TRIG 70 09/16/2020 0955  ? HDL 44 09/16/2020 0955  ? CHOLHDL 4.3 09/16/2020 0955  ? CHOLHDL 3.6 01/12/2015 0001  ? VLDL 13 01/12/2015 0001  ? LDLCALC 132 (H) 09/16/2020 0955  ? ? ?Physical Exam:   ? ?VS:  BP (!) 146/76   Pulse 61    Ht 5' 10.6" (1.793 m)   Wt 196 lb 1.3 oz (88.9 kg)   SpO2 98%   BMI 27.66 kg/m?    ? ?Wt Readings from Last 3 Encounters:  ?11/01/21 196 lb 1.3 oz (88.9 kg)  ?09/29/21 191 lb 3.2 oz (86.7 kg)  ?09/22/21 192 lb 3.2 oz (87.2 kg)  ?  ? ?GEN: Patient is in no acute distress ?HEENT: Normal ?NECK: No JVD; No carotid bruits ?LYMPHATICS: No lymphadenopathy ?CARDIAC: Hear sounds regular, 2/6 systolic murmur at the apex. ?RESPIRATORY:  Clear to auscultation without rales, wheezing or rhonchi  ?ABDOMEN: Soft, non-tender, non-distended ?MUSCULOSKELETAL:  No edema; No deformity  ?SKIN: Warm and dry ?NEUROLOGIC:  Alert and oriented x 3 ?  PSYCHIATRIC:  Normal affect  ? ?Signed, ?Jonathan Brothers, Archer  ?11/01/2021 2:54 PM    ?[facility]  ?

## 2021-11-01 NOTE — Patient Instructions (Signed)
Medication Instructions:  Your physician recommends that you continue on your current medications as directed. Please refer to the Current Medication list given to you today.  *If you need a refill on your cardiac medications before your next appointment, please call your pharmacy*   Lab Work: None ordered If you have labs (blood work) drawn today and your tests are completely normal, you will receive your results only by: MyChart Message (if you have MyChart) OR A paper copy in the mail If you have any lab test that is abnormal or we need to change your treatment, we will call you to review the results.   Testing/Procedures: Your physician has requested that you have a lexiscan myoview. For further information please visit [email or web address]. Please follow instruction sheet, as given.  The test will take approximately 3 to 4 hours to complete; you may bring reading material.  If someone comes with you to your appointment, they will need to remain in the main lobby due to limited space in the testing area. **If you are pregnant or breastfeeding, please notify the nuclear lab prior to your appointment**  How to prepare for your Myocardial Perfusion Test: Do not eat or drink 3 hours prior to your test, except you may have water. Do not consume products containing caffeine (regular or decaffeinated) 12 hours prior to your test. (ex: coffee, chocolate, sodas, tea). Do bring a list of your current medications with you.  If not listed below, you may take your medications as normal. Do wear comfortable clothes (no dresses or overalls) and walking shoes, tennis shoes preferred (No heels or open toe shoes are allowed). Do NOT wear cologne, perfume, aftershave, or lotions (deodorant is allowed). If these instructions are not followed, your test will have to be rescheduled.    Follow-Up: At [facility], you and your health needs are our priority.  As part of our continuing mission to provide  you with exceptional heart care, we have created designated Provider Care Teams.  These Care Teams include your primary Cardiologist (physician) and Advanced Practice Providers (APPs -  Physician Assistants and Nurse Practitioners) who all work together to provide you with the care you need, when you need it.  We recommend signing up for the patient portal called "MyChart".  Sign up information is provided on this After Visit Summary.  MyChart is used to connect with patients for Virtual Visits (Telemedicine).  Patients are able to view lab/test results, encounter notes, upcoming appointments, etc.  Non-urgent messages can be sent to your provider as well.   To learn more about what you can do with MyChart, go to [email or web address].    Your next appointment:   9 month(s)  The format for your next appointment:   In Person  Provider:   Rajan Revankar, MD   Other Instructions Cardiac Nuclear Scan A cardiac nuclear scan is a test that is done to check the flow of blood to your heart. It is done when you are resting and when you are exercising. The test looks for problems such as: Not enough blood reaching a portion of the heart. The heart muscle not working as it should. You may need this test if: You have heart disease. You have had lab results that are not normal. You have had heart surgery or a balloon procedure to open up blocked arteries (angioplasty). You have chest pain. You have shortness of breath. In this test, a special dye (tracer) is put into your bloodstream. The tracer will travel   to your heart. A camera will then take pictures of your heart to see how the tracer moves through your heart. This test is usually done at a hospital and takes 2-4 hours. Tell a doctor about: Any allergies you have. All medicines you are taking, including vitamins, herbs, eye drops, creams, and over-the-counter medicines. Any problems you or family members have had with anesthetic  medicines. Any blood disorders you have. Any surgeries you have had. Any medical conditions you have. Whether you are pregnant or may be pregnant. What are the risks? Generally, this is a safe test. However, problems may occur, such as: Serious chest pain and heart attack. This is only a risk if the stress portion of the test is done. Rapid heartbeat. A feeling of warmth in your chest. This feeling usually does not last long. Allergic reaction to the tracer. What happens before the test? Ask your doctor about changing or stopping your normal medicines. This is important. Follow instructions from your doctor about what you cannot eat or drink. Remove your jewelry on the day of the test. What happens during the test? An IV tube will be inserted into one of your veins. Your doctor will give you a small amount of tracer through the IV tube. You will wait for 20-40 minutes while the tracer moves through your bloodstream. Your heart will be monitored with an electrocardiogram (ECG). You will lie down on an exam table. Pictures of your heart will be taken for about 15-20 minutes. You may also have a stress test. For this test, one of these things may be done: You will be asked to exercise on a treadmill or a stationary bike. You will be given medicines that will make your heart work harder. This is done if you are unable to exercise. When blood flow to your heart has peaked, a tracer will again be given through the IV tube. After 20-40 minutes, you will get back on the exam table. More pictures will be taken of your heart. Depending on the tracer that is used, more pictures may need to be taken 3-4 hours later. Your IV tube will be removed when the test is over. The test may vary among doctors and hospitals. What happens after the test? Ask your doctor: Whether you can return to your normal schedule, including diet, activities, and medicines. Whether you should drink more fluids. This will  help to remove the tracer from your body. Drink enough fluid to keep your pee (urine) pale yellow. Ask your doctor, or the department that is doing the test: When will my results be ready? How will I get my results? Summary A cardiac nuclear scan is a test that is done to check the flow of blood to your heart. Tell your doctor whether you are pregnant or may be pregnant. Before the test, ask your doctor about changing or stopping your normal medicines. This is important. Ask your doctor whether you can return to your normal activities. You may be asked to drink more fluids. This information is not intended to replace advice given to you by your health care provider. Make sure you discuss any questions you have with your health care provider. Document Revised: 10/30/2018 Document Reviewed: 12/24/2017 Elsevier Patient Education  2021 Elsevier Inc.    

## 2021-11-07 ENCOUNTER — Telehealth (HOSPITAL_COMMUNITY): Payer: Self-pay | Admitting: *Deleted

## 2021-11-07 NOTE — Telephone Encounter (Signed)
Left message on voicemail per DPR in reference to upcoming appointment scheduled on 11/14/2021 at 7:30 with detailed instructions given per Myocardial Perfusion Study Information Sheet for the test. LM to arrive 15 minutes early, and that it is imperative to arrive on time for appointment to keep from having the test rescheduled. If you need to cancel or reschedule your appointment, please call the office within 24 hours of your appointment. Failure to do so may result in a cancellation of your appointment, and a $50 no show fee. Phone number given for call back for any questions.  ? ?

## 2021-11-14 ENCOUNTER — Ambulatory Visit (HOSPITAL_COMMUNITY): Payer: No Typology Code available for payment source | Attending: Cardiology

## 2021-11-14 DIAGNOSIS — R079 Chest pain, unspecified: Secondary | ICD-10-CM

## 2021-11-14 DIAGNOSIS — Z01810 Encounter for preprocedural cardiovascular examination: Secondary | ICD-10-CM

## 2021-11-14 LAB — MYOCARDIAL PERFUSION IMAGING
LV dias vol: 135 mL (ref 62–150)
LV sys vol: 63 mL
Nuc Stress EF: 53 %
Peak HR: 85 {beats}/min
Rest HR: 58 {beats}/min
Rest Nuclear Isotope Dose: 10.5 mCi
SDS: 0
SRS: 0
SSS: 0
ST Depression (mm): 0 mm
Stress Nuclear Isotope Dose: 31.9 mCi
TID: 1.16

## 2021-11-14 MED ORDER — REGADENOSON 0.4 MG/5ML IV SOLN
0.4000 mg | Freq: Once | INTRAVENOUS | Status: AC
  Administered 2021-11-14: 0.4 mg via INTRAVENOUS

## 2021-11-14 MED ORDER — TECHNETIUM TC 99M TETROFOSMIN IV KIT
10.5000 | PACK | Freq: Once | INTRAVENOUS | Status: AC | PRN
  Administered 2021-11-14: 10.5 via INTRAVENOUS
  Filled 2021-11-14: qty 11

## 2021-11-14 MED ORDER — TECHNETIUM TC 99M TETROFOSMIN IV KIT
31.9000 | PACK | Freq: Once | INTRAVENOUS | Status: AC | PRN
  Administered 2021-11-14: 31.9 via INTRAVENOUS
  Filled 2021-11-14: qty 32

## 2021-12-06 ENCOUNTER — Other Ambulatory Visit: Payer: Self-pay | Admitting: Cardiology

## 2021-12-06 ENCOUNTER — Telehealth: Payer: Self-pay | Admitting: Cardiology

## 2021-12-06 DIAGNOSIS — I2782 Chronic pulmonary embolism: Secondary | ICD-10-CM

## 2021-12-06 NOTE — Telephone Encounter (Signed)
Please call the pt in reference to labs. If he is coming to [facility] her is the lab information. Make sure the order is also released in the computer. ? ? ?The first one is a Therapist, sports. ?[facility] [address] in [place]. They also close daily for lunch for 12-1. ?  ?[address] 2nd floor M-W 8-11:30 and 1-4:30 and Thursday and Friday 8-11:30. ? ?

## 2021-12-06 NOTE — Addendum Note (Signed)
Addended by: Memory Dance on: 12/06/2021 02:55 PM ? ? Modules accepted: Orders ? ?

## 2021-12-06 NOTE — Telephone Encounter (Addendum)
Prescription refill request for Eliquis received. ?Indication:PE ?Last office visit:11/01/21 (Revankar)  ?Scr: 1.15 (09/16/20)  ?Age: 51 ?Weight: 88.9kg ? ?Labs overdue. Confirmed with PCP labs have not be drawn in over 1 year. Called pt to schedule labs, no answer. Left message. 30 day supply sent to requested pharmacy.  ? ? ?

## 2021-12-06 NOTE — Telephone Encounter (Signed)
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 He needs this for his medication. Please advise  ?

## 2021-12-06 NOTE — Telephone Encounter (Signed)
Called pt and placed order for CBC/BMET to be drawn at [facility] [address] in [place], tomorrow (12/07/21). Instructed pt to call back if he has any questions or issues. Pt verbalized understanding.  ?

## 2021-12-08 LAB — BASIC METABOLIC PANEL
BUN/Creatinine Ratio: 11 (ref 9–20)
BUN: 14 mg/dL (ref 6–24)
CO2: 24 mmol/L (ref 20–29)
Calcium: 9.7 mg/dL (ref 8.7–10.2)
Chloride: 100 mmol/L (ref 96–106)
Creatinine, Ser: 1.24 mg/dL (ref 0.76–1.27)
Glucose: 103 mg/dL — ABNORMAL HIGH (ref 70–99)
Potassium: 4.9 mmol/L (ref 3.5–5.2)
Sodium: 136 mmol/L (ref 134–144)
eGFR: 71 mL/min/{1.73_m2} (ref >59)

## 2021-12-08 LAB — CBC
Hematocrit: 45 % (ref 37.5–51.0)
Hemoglobin: 14.9 g/dL (ref 13.0–17.7)
MCH: 29.5 pg (ref 26.6–33.0)
MCHC: 33.1 g/dL (ref 31.5–35.7)
MCV: 89 fL (ref 79–97)
Platelets: 237 10*3/uL (ref 150–450)
RBC: 5.05 x10E6/uL (ref 4.14–5.80)
RDW: 13.8 % (ref 11.6–15.4)
WBC: 5.1 10*3/uL (ref 3.4–10.8)

## 2022-01-12 ENCOUNTER — Other Ambulatory Visit: Payer: Self-pay | Admitting: Cardiology

## 2022-01-12 DIAGNOSIS — I2782 Chronic pulmonary embolism: Secondary | ICD-10-CM

## 2022-01-12 NOTE — Telephone Encounter (Signed)
Eliquis 5mg  refill request received. Patient is 51 years old, weight-88.9kg, Crea-1.24 on 12/07/2021, Diagnosis-PE, and last seen by Dr. 12/09/2021 on 11/01/2021. Dose is appropriate based on dosing criteria. Will send in refill to requested pharmacy.

## 2022-02-07 ENCOUNTER — Encounter: Payer: PRIVATE HEALTH INSURANCE | Admitting: Family Medicine

## 2022-02-07 ENCOUNTER — Encounter: Payer: Self-pay | Admitting: Family Medicine

## 2022-02-07 ENCOUNTER — Ambulatory Visit (INDEPENDENT_AMBULATORY_CARE_PROVIDER_SITE_OTHER): Payer: PRIVATE HEALTH INSURANCE | Admitting: Family Medicine

## 2022-02-07 VITALS — BP 130/80 | HR 71 | Temp 98.1°F | Ht 69.0 in | Wt 188.0 lb

## 2022-02-07 DIAGNOSIS — Z7901 Long term (current) use of anticoagulants: Secondary | ICD-10-CM | POA: Diagnosis not present

## 2022-02-07 DIAGNOSIS — Z0000 Encounter for general adult medical examination without abnormal findings: Secondary | ICD-10-CM

## 2022-02-07 DIAGNOSIS — Z86711 Personal history of pulmonary embolism: Secondary | ICD-10-CM

## 2022-02-07 DIAGNOSIS — Z1159 Encounter for screening for other viral diseases: Secondary | ICD-10-CM

## 2022-02-07 DIAGNOSIS — Z8249 Family history of ischemic heart disease and other diseases of the circulatory system: Secondary | ICD-10-CM | POA: Diagnosis not present

## 2022-02-07 DIAGNOSIS — J301 Allergic rhinitis due to pollen: Secondary | ICD-10-CM

## 2022-02-07 DIAGNOSIS — Z1211 Encounter for screening for malignant neoplasm of colon: Secondary | ICD-10-CM

## 2022-02-07 DIAGNOSIS — M2410 Other articular cartilage disorders, unspecified site: Secondary | ICD-10-CM

## 2022-02-07 DIAGNOSIS — E785 Hyperlipidemia, unspecified: Secondary | ICD-10-CM

## 2022-02-07 NOTE — Progress Notes (Signed)
 Complete physical exam  Patient: Jonathan Archer   DOB: 02/26/1971   50 y.o. Male  MRN: [id number]  Subjective:    Chief Complaint  Patient presents with   Annual Exam    Non fasting     Jonathan Archer is a 50 y.o. male who presents today for a complete physical exam. He reports consuming a general diet. The patient has a physically strenuous job, but has no regular exercise apart from work.  He generally feels well. He reports sleeping well. He continues on Eliquis due to to previous episodes of PE.  He had a recent reinjury to his left knee causing some microfractures.  He does have reflux disease and is using Prilosec on an as-needed basis.  His allergies seem to be under good control.  He does see his cardiologist regularly with a previous family history of heart disease.  He is in a long-term relationship which is going well.   Most recent fall risk assessment:    06/18/2013    2:55 PM  Fall Risk   Falls in the past year? 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   Mallet finger of left hand 06/28/2021   ED (erectile dysfunction)    Hypogonadism male    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   Paresthesia 07/15/2018   Atypical chest pain 06/24/2018   History of pulmonary embolism 06/24/2018   Hypercoagulability due to atrial fibrillation (HCC) 05/08/2018   Mixed dyslipidemia 04/24/2018   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/12/2018   Family history of heart disease in male family member before age 65 04/12/2018   Pain in left knee 10/17/2017   Allergic rhinitis due to pollen 01/12/2015   Arthritis of hip 09/14/2011   Past Medical History:  Diagnosis Date   Allergic rhinitis due to pollen 01/12/2015   Angina pectoris (HCC)  06/27/2018   Arthritis of hip 09/14/2011   Atypical chest pain 06/24/2018   Current use of long term anticoagulation 09/22/2021   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   DVT (deep venous thrombosis) (HCC)    ED (erectile dysfunction)    Family history of heart disease in male family member before age 65 04/12/2018   History of pulmonary embolism 06/24/2018   History of pulmonary embolus (PE)    Hypercoagulability due to atrial fibrillation (HCC) 05/08/2018   Hyperlipidemia 11/04/2020   Hypogonadism male    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/12/2018   Mallet finger of left hand 06/28/2021   Mixed dyslipidemia 04/24/2018   Pain in left knee 10/17/2017   Paresthesia    Pre-operative cardiovascular examination 04/24/2018   Pulmonary embolism (HCC) 05/08/2018   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   Past Surgical History:  Procedure Laterality Date   MENISECTOMY  2006   Social History   Tobacco Use   Smoking status: Never   Smokeless tobacco: Never  Vaping Use   Vaping Use: Never used  Substance Use Topics   Alcohol use: Yes    Comment: rarely   Drug use: Yes    Types: Marijuana    Comment: small amount daily   Family History  Problem Relation Age of Onset     Heart disease Sister    Heart attack Sister    Heart disease Mother    Heart attack Mother    Heart disease Father    Heart attack Father    Clotting disorder Father    Clotting disorder Maternal Grandmother    Clotting disorder Paternal Grandmother    Allergies  Allergen Reactions   Vicodin [Hydrocodone-Acetaminophen] Other (See Comments)    Eyes burn      Patient Care Team: Lalonde, [name], MD as PCP - General (Family Medicine) Dickson, Christopher S, MD as Consulting Physician (Vascular Surgery)   Outpatient Medications Prior to Visit  Medication Sig   apixaban (ELIQUIS) 5 MG TABS tablet TAKE 1 TABLET BY MOUTH TWICE DAILY   cetirizine (ZYRTEC) 10 MG tablet Take 10 mg by mouth daily.   Multiple Vitamin (MULTIVITAMIN)  tablet Take 1 tablet by mouth daily.   omeprazole (PRILOSEC) 20 MG capsule Take 20 mg by mouth daily.   nitroGLYCERIN (NITROSTAT) 0.4 MG SL tablet Place 0.4 mg under the tongue every 5 (five) minutes as needed for chest pain. (Patient not taking: Reported on 02/07/2022)   No facility-administered medications prior to visit.    Review of Systems  All other systems reviewed and are negative.         Objective:     BP 130/80   Pulse 71   Temp 98.1 F (36.7 C)   Ht 5' 9" (1.753 m)   Wt 188 lb (85.3 kg)   SpO2 97%   BMI 27.76 kg/m  BP Readings from Last 3 Encounters:  02/07/22 130/80  11/01/21 (!) 146/76  09/29/21 140/90   Wt Readings from Last 3 Encounters:  02/07/22 188 lb (85.3 kg)  11/14/21 196 lb (88.9 kg)  11/01/21 196 lb 1.3 oz (88.9 kg)      Physical Exam  Alert and in no distress. Tympanic membranes and canals are normal. Pharyngeal area is normal. Neck is supple without adenopathy or thyromegaly. Cardiac exam shows a regular sinus rhythm without murmurs or gallops. Lungs are clear to auscultation.  Last CBC Lab Results  Component Value Date   WBC 5.1 12/07/2021   HGB 14.9 12/07/2021   HCT 45.0 12/07/2021   MCV 89 12/07/2021   MCH 29.5 12/07/2021   RDW 13.8 12/07/2021   PLT 237 12/07/2021   Last metabolic panel Lab Results  Component Value Date   GLUCOSE 103 (H) 12/07/2021   NA 136 12/07/2021   K 4.9 12/07/2021   CL 100 12/07/2021   CO2 24 12/07/2021   BUN 14 12/07/2021   CREATININE 1.24 12/07/2021   EGFR 71 12/07/2021   CALCIUM 9.7 12/07/2021   PROT 6.8 09/16/2020   ALBUMIN 4.5 09/16/2020   LABGLOB 2.7 01/07/2018   AGRATIO 1.7 01/07/2018   BILITOT 0.4 09/16/2020   ALKPHOS 80 09/16/2020   AST 24 09/16/2020   ALT 20 09/16/2020   ANIONGAP 8 06/26/2018   Last lipids Lab Results  Component Value Date   CHOL 189 09/16/2020   HDL 44 09/16/2020   LDLCALC 132 (H) 09/16/2020   TRIG 70 09/16/2020   CHOLHDL 4.3 09/16/2020        Assessment  & Plan:  Routine general medical examination at a health care facility - Plan: CBC with Differential/Platelet, Comprehensive metabolic panel, Lipid panel  Current use of long term anticoagulation  Family history of heart disease in male family member before age 65  History of pulmonary embolus (PE)  Hyperlipidemia, unspecified hyperlipidemia type - Plan:   Lipid panel  Allergic rhinitis due to pollen, unspecified seasonality  Defect of articular cartilage  Need for hepatitis C screening test - Plan: Hepatitis C antibody  Colon cancer screening - Plan: Cologuard  Immunization History  Administered Date(s) Administered   Influenza,inj,Quad PF,6+ Mos 04/12/2018, 09/22/2021   PFIZER Comirnaty(Gray Top)Covid-19 Tri-Sucrose Vaccine 11/04/2020   Tdap 09/20/2021    Health Maintenance  Topic Date Due   Hepatitis C Screening  Never done   Fecal DNA (Cologuard)  Never done   COVID-19 Vaccine (2 - Pfizer series) 12/30/2020   Zoster Vaccines- Shingrix (1 of 2) Never done   HIV Screening  02/08/2023 (Originally 02/26/1986)   INFLUENZA VACCINE  02/21/2022   TETANUS/TDAP  09/21/2031   HPV VACCINES  Aged Out  Discussed the knee pain with him and did think that microfracture therapy would be the best way to handle that.  He will continue on his allergy medications recommend he switch to an H2 blocker instead of the Prilosec  Discussed health benefits of physical activity, and encouraged him to engage in regular exercise appropriate for his age and condition.  Problem List Items Addressed This Visit     Allergic rhinitis due to pollen   Current use of long term anticoagulation   Defect of articular cartilage   Family history of heart disease in male family member before age 65   History of pulmonary embolism   RESOLVED: History of pulmonary embolus (PE)   RESOLVED: Hyperlipidemia   Relevant Orders   Lipid panel   Mixed dyslipidemia   Pain in left knee   Other Visit Diagnoses      Routine general medical examination at a health care facility    -  Primary   Relevant Orders   CBC with Differential/Platelet   Comprehensive metabolic panel   Lipid panel   Need for hepatitis C screening test       Relevant Orders   Hepatitis C antibody   Colon cancer screening       Relevant Orders   Cologuard      Return in about 1 year (around 02/08/2023) for cpe .     John Lalonde, MD   

## 2022-02-07 NOTE — Patient Instructions (Addendum)
Try Tagamet for Axid or Zantac for your acid indigestion Health Maintenance, Male Adopting a healthy lifestyle and getting preventive care are important in promoting health and wellness. Ask your health care provider about: The right schedule for you to have regular tests and exams. Things you can do on your own to prevent diseases and keep yourself healthy. What should I know about diet, weight, and exercise? Eat a healthy diet  Eat a diet that includes plenty of vegetables, fruits, low-fat dairy products, and lean protein. Do not eat a lot of foods that are high in solid fats, added sugars, or sodium. Maintain a healthy weight Body mass index (BMI) is a measurement that can be used to identify possible weight problems. It estimates body fat based on height and weight. Your health care provider can help determine your BMI and help you achieve or maintain a healthy weight. Get regular exercise Get regular exercise. This is one of the most important things you can do for your health. Most adults should: Exercise for at least 150 minutes each week. The exercise should increase your heart rate and make you sweat (moderate-intensity exercise). Do strengthening exercises at least twice a week. This is in addition to the moderate-intensity exercise. Spend less time sitting. Even light physical activity can be beneficial. Watch cholesterol and blood lipids Have your blood tested for lipids and cholesterol at 51 years of age, then have this test every 5 years. You may need to have your cholesterol levels checked more often if: Your lipid or cholesterol levels are high. You are older than 51 years of age. You are at high risk for heart disease. What should I know about cancer screening? Many types of cancers can be detected early and may often be prevented. Depending on your health history and family history, you may need to have cancer screening at various ages. This may include screening  for: Colorectal cancer. Prostate cancer. Skin cancer. Lung cancer. What should I know about heart disease, diabetes, and high blood pressure? Blood pressure and heart disease High blood pressure causes heart disease and increases the risk of stroke. This is more likely to develop in people who have high blood pressure readings or are overweight. Talk with your health care provider about your target blood pressure readings. Have your blood pressure checked: Every 3-5 years if you are 70-46 years of age. Every year if you are 3 years old or older. If you are between the ages of 58 and 22 and are a current or former smoker, ask your health care provider if you should have a one-time screening for abdominal aortic aneurysm (AAA). Diabetes Have regular diabetes screenings. This checks your fasting blood sugar level. Have the screening done: Once every three years after age 84 if you are at a normal weight and have a low risk for diabetes. More often and at a younger age if you are overweight or have a high risk for diabetes. What should I know about preventing infection? Hepatitis B If you have a higher risk for hepatitis B, you should be screened for this virus. Talk with your health care provider to find out if you are at risk for hepatitis B infection. Hepatitis C Blood testing is recommended for: Everyone born from [birth year] through 1965. Anyone with known risk factors for hepatitis C. Sexually transmitted infections (STIs) You should be screened each year for STIs, including gonorrhea and chlamydia, if: You are sexually active and are younger than 51 years of age.  You are older than 51 years of age and your health care provider tells you that you are at risk for this type of infection. Your sexual activity has changed since you were last screened, and you are at increased risk for chlamydia or gonorrhea. Ask your health care provider if you are at risk. Ask your health care provider about  whether you are at high risk for HIV. Your health care provider may recommend a prescription medicine to help prevent HIV infection. If you choose to take medicine to prevent HIV, you should first get tested for HIV. You should then be tested every 3 months for as long as you are taking the medicine. Follow these instructions at home: Alcohol use Do not drink alcohol if your health care provider tells you not to drink. If you drink alcohol: Limit how much you have to 0-2 drinks a day. Know how much alcohol is in your drink. In the [place] one drink equals one 12 oz bottle of beer (355 mL), one 5 oz glass of wine (148 mL), or one 1 oz glass of hard liquor (44 mL). Lifestyle Do not use any products that contain nicotine or tobacco. These products include cigarettes, chewing tobacco, and vaping devices, such as e-cigarettes. If you need help quitting, ask your health care provider. Do not use street drugs. Do not share needles. Ask your health care provider for help if you need support or information about quitting drugs. General instructions Schedule regular health, dental, and eye exams. Stay current with your vaccines. Tell your health care provider if: You often feel depressed. You have ever been abused or do not feel safe at home. Summary Adopting a healthy lifestyle and getting preventive care are important in promoting health and wellness. Follow your health care provider's instructions about healthy diet, exercising, and getting tested or screened for diseases. Follow your health care provider's instructions on monitoring your cholesterol and blood pressure. This information is not intended to replace advice given to you by your health care provider. Make sure you discuss any questions you have with your health care provider. Document Revised: 11/29/2020 Document Reviewed: 11/29/2020 Elsevier Patient Education  [place].

## 2022-02-08 LAB — COMPREHENSIVE METABOLIC PANEL
ALT: 19 IU/L (ref 0–44)
AST: 25 IU/L (ref 0–40)
Albumin/Globulin Ratio: 2.4 — ABNORMAL HIGH (ref 1.2–2.2)
Albumin: 5 g/dL (ref 4.1–5.1)
Alkaline Phosphatase: 73 IU/L (ref 44–121)
BUN/Creatinine Ratio: 9 (ref 9–20)
BUN: 11 mg/dL (ref 6–24)
Bilirubin Total: 0.4 mg/dL (ref 0.0–1.2)
CO2: 21 mmol/L (ref 20–29)
Calcium: 9.5 mg/dL (ref 8.7–10.2)
Chloride: 103 mmol/L (ref 96–106)
Creatinine, Ser: 1.28 mg/dL — ABNORMAL HIGH (ref 0.76–1.27)
Globulin, Total: 2.1 g/dL (ref 1.5–4.5)
Glucose: 77 mg/dL (ref 70–99)
Potassium: 4.3 mmol/L (ref 3.5–5.2)
Sodium: 140 mmol/L (ref 134–144)
Total Protein: 7.1 g/dL (ref 6.0–8.5)
eGFR: 68 mL/min/{1.73_m2} (ref >59)

## 2022-02-08 LAB — CBC WITH DIFFERENTIAL/PLATELET
Basophils Absolute: 0 10*3/uL (ref 0.0–0.2)
Basos: 0 %
EOS (ABSOLUTE): 0 10*3/uL (ref 0.0–0.4)
Eos: 0 %
Hematocrit: 41.8 % (ref 37.5–51.0)
Hemoglobin: 14.4 g/dL (ref 13.0–17.7)
Immature Grans (Abs): 0 10*3/uL (ref 0.0–0.1)
Immature Granulocytes: 0 %
Lymphocytes Absolute: 2.2 10*3/uL (ref 0.7–3.1)
Lymphs: 36 %
MCH: 29.9 pg (ref 26.6–33.0)
MCHC: 34.4 g/dL (ref 31.5–35.7)
MCV: 87 fL (ref 79–97)
Monocytes Absolute: 0.4 10*3/uL (ref 0.1–0.9)
Monocytes: 7 %
Neutrophils Absolute: 3.5 10*3/uL (ref 1.4–7.0)
Neutrophils: 57 %
Platelets: 254 10*3/uL (ref 150–450)
RBC: 4.82 x10E6/uL (ref 4.14–5.80)
RDW: 13.1 % (ref 11.6–15.4)
WBC: 6.2 10*3/uL (ref 3.4–10.8)

## 2022-02-08 LAB — LIPID PANEL
Chol/HDL Ratio: 4.7 ratio (ref 0.0–5.0)
Cholesterol, Total: 182 mg/dL (ref 100–199)
HDL: 39 mg/dL — ABNORMAL LOW
LDL Chol Calc (NIH): 119 mg/dL — ABNORMAL HIGH (ref 0–99)
Triglycerides: 133 mg/dL (ref 0–149)
VLDL Cholesterol Cal: 24 mg/dL (ref 5–40)

## 2022-02-08 LAB — HEPATITIS C ANTIBODY: Hep C Virus Ab: NONREACTIVE

## 2022-03-01 ENCOUNTER — Encounter (HOSPITAL_BASED_OUTPATIENT_CLINIC_OR_DEPARTMENT_OTHER): Payer: Self-pay | Admitting: Emergency Medicine

## 2022-03-01 ENCOUNTER — Emergency Department (HOSPITAL_BASED_OUTPATIENT_CLINIC_OR_DEPARTMENT_OTHER)
Admission: EM | Admit: 2022-03-01 | Discharge: 2022-03-01 | Disposition: A | Discharge status: Home / Self Care | Payer: No Typology Code available for payment source | Attending: Emergency Medicine | Admitting: Emergency Medicine

## 2022-03-01 ENCOUNTER — Other Ambulatory Visit: Payer: Self-pay

## 2022-03-01 ENCOUNTER — Emergency Department (HOSPITAL_BASED_OUTPATIENT_CLINIC_OR_DEPARTMENT_OTHER): Payer: No Typology Code available for payment source

## 2022-03-01 DIAGNOSIS — R0789 Other chest pain: Secondary | ICD-10-CM | POA: Insufficient documentation

## 2022-03-01 DIAGNOSIS — R079 Chest pain, unspecified: Secondary | ICD-10-CM

## 2022-03-01 DIAGNOSIS — R001 Bradycardia, unspecified: Secondary | ICD-10-CM | POA: Insufficient documentation

## 2022-03-01 DIAGNOSIS — Z7901 Long term (current) use of anticoagulants: Secondary | ICD-10-CM | POA: Insufficient documentation

## 2022-03-01 LAB — CBC
HCT: 41.8 % (ref 39.0–52.0)
Hemoglobin: 14.3 g/dL (ref 13.0–17.0)
MCH: 29.9 pg (ref 26.0–34.0)
MCHC: 34.2 g/dL (ref 30.0–36.0)
MCV: 87.3 fL (ref 80.0–100.0)
Platelets: 232 10*3/uL (ref 150–400)
RBC: 4.79 MIL/uL (ref 4.22–5.81)
RDW: 12.7 % (ref 11.5–15.5)
WBC: 5.5 10*3/uL (ref 4.0–10.5)
nRBC: 0 % (ref 0.0–0.2)

## 2022-03-01 LAB — BASIC METABOLIC PANEL
Anion gap: 5 (ref 5–15)
BUN: 14 mg/dL (ref 6–20)
CO2: 25 mmol/L (ref 22–32)
Calcium: 9.1 mg/dL (ref 8.9–10.3)
Chloride: 108 mmol/L (ref 98–111)
Creatinine, Ser: 1.2 mg/dL (ref 0.61–1.24)
GFR, Estimated: >60 mL/min (ref >60)
Glucose, Bld: 118 mg/dL — ABNORMAL HIGH (ref 70–99)
Potassium: 3.8 mmol/L (ref 3.5–5.1)
Sodium: 138 mmol/L (ref 135–145)

## 2022-03-01 LAB — TROPONIN I (HIGH SENSITIVITY)
Troponin I (High Sensitivity): 4 ng/L (ref <18)
Troponin I (High Sensitivity): 3 ng/L (ref <18)

## 2022-03-01 MED ORDER — ACETAMINOPHEN 325 MG PO TABS
650.0000 mg | ORAL_TABLET | Freq: Once | ORAL | Status: AC
  Administered 2022-03-01: 650 mg via ORAL
  Filled 2022-03-01: qty 2

## 2022-03-01 NOTE — Discharge Instructions (Signed)
If you develop recurrent, continued, or worsening chest pain, shortness of breath, fever, vomiting, abdominal or back pain, or any other new/concerning symptoms then return to the ER for evaluation.  

## 2022-03-01 NOTE — ED Triage Notes (Signed)
Upper left Chest pain X 1 hour.

## 2022-03-01 NOTE — ED Notes (Signed)
ED Provider at bedside. 

## 2022-03-01 NOTE — ED Provider Notes (Addendum)
[facility] EMERGENCY DEPARTMENT Provider Note   CSN: [id number] Arrival date & time: 03/01/22  7902     History  Chief Complaint  Patient presents with   Chest Pain    Jonathan Archer is a 51 y.o. male.  HPI 51 year old male presents with chest pain.  He states he woke up and had some acid reflux which did not surprise him based on dinner last night and he gets this fairly often.  Went to the bathroom and was getting ready to go in the shower when he all of a sudden felt nauseated and like he had to have a bowel movement.  Right after that he developed transient chest pain.  It was in his left anterior chest and felt like a pressure/heaviness and went up towards his left trapezius.  Overall lasted 30-45 seconds.  Since then has had intermittent heart fluttering which is not a new problem for him.  He did feel like it was hard to breathe at the time.  Did not radiate down his arm or into his back.  Since then he has developed a headache and would like some Tylenol.  Patient is concerned because his sister had a similar presentation when she developed her heart attack at the age of 63.  His dad also had a similar presentation and had a heart attack and passed away when he was in his 2s.  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  No new leg swelling.  Patient denies history of hypertension.  Hyperlipidemia is listed in his chart though he is not on meds for this and he denies this.  Does not smoke or have diabetes.  Home Medications Prior to Admission medications   Medication Sig Start Date End Date Taking? Authorizing Provider  apixaban (ELIQUIS) 5 MG TABS tablet TAKE 1 TABLET BY MOUTH TWICE DAILY 01/12/22  Yes Revankar, Aundra Dubin, MD  cetirizine (ZYRTEC) 10 MG tablet Take 10 mg by mouth daily.   Yes [provider]  Multiple Vitamin (MULTIVITAMIN) tablet Take 1 tablet by mouth daily.   Yes [provider]  omeprazole (PRILOSEC) 20 MG  capsule Take 20 mg by mouth daily.   Yes [provider]  nitroGLYCERIN (NITROSTAT) 0.4 MG SL tablet Place 0.4 mg under the tongue every 5 (five) minutes as needed for chest pain. Patient not taking: Reported on 02/07/2022    [provider]      Allergies    Vicodin [hydrocodone-acetaminophen]    Review of Systems   Review of Systems  Respiratory:  Positive for shortness of breath.   Cardiovascular:  Positive for chest pain and palpitations. Negative for leg swelling.  Gastrointestinal:  Negative for vomiting.  Musculoskeletal:  Negative for back pain.    Physical Exam Updated Vital Signs BP (!) 152/85   Pulse (!) 42   Temp 97.9 F (36.6 C) (Oral)   Resp 16   Ht 5\' 10"  (1.778 m)   Wt 83.9 kg   SpO2 100%   BMI 26.54 kg/m  Physical Exam Vitals and nursing note reviewed.  Constitutional:      General: He is not in acute distress.    Appearance: He is well-developed. He is not ill-appearing or diaphoretic.  HENT:     Head: Normocephalic and atraumatic.  Cardiovascular:     Rate and Rhythm: Regular rhythm. Bradycardia present.     Heart sounds: Normal heart sounds.  Pulmonary:     Effort: Pulmonary effort  is normal.     Breath sounds: Normal breath sounds.  Chest:     Chest wall: Tenderness (mild, left sided) present.  Abdominal:     Palpations: Abdomen is soft.     Tenderness: There is no abdominal tenderness.  Musculoskeletal:     Right lower leg: No edema.     Left lower leg: No edema.  Skin:    General: Skin is warm and dry.  Neurological:     Mental Status: He is alert.     ED Results / Procedures / Treatments   Labs (all labs ordered are listed, but only abnormal results are displayed) Labs Reviewed  BASIC METABOLIC PANEL - Abnormal; Notable for the following components:      Result Value   Glucose, Bld 118 (*)    All other components within normal limits  CBC  TROPONIN I (HIGH SENSITIVITY)  TROPONIN I (HIGH SENSITIVITY)     EKG EKG Interpretation  Date/Time:  Wednesday March 01 2022 07:32:17 EDT Ventricular Rate:  44 PR Interval:  157 QRS Duration: 120 QT Interval:  454 QTC Calculation: 389 R Axis:   80 Text Interpretation: Sinus bradycardia IVCD, consider atypical RBBB similar to earlier in the day and 2019 Confirmed by Pricilla Loveless [phone]) on 03/01/2022 7:57:11 AM  Radiology DG Chest 2 View  Result Date: 03/01/2022 CLINICAL DATA:  51 year old male with upper left chest pain. History of DVT and PE. EXAM: CHEST - 2 VIEW COMPARISON:  Chest radiographs 06/26/2018 and earlier. CTA 06/26/2018. FINDINGS: Lung volumes and mediastinal contours remain normal. Visualized tracheal air column is within normal limits. Both lungs appear clear. No pneumothorax or pleural effusion. No acute osseous abnormality identified. Negative visible bowel gas. IMPRESSION: Negative.  No acute cardiopulmonary abnormality. Electronically Signed   By: Odessa Fleming M.D.   On: 03/01/2022 07:01    Procedures Procedures    Medications Ordered in ED Medications  acetaminophen (TYLENOL) tablet 650 mg (650 mg Oral Given 03/01/22 3875)    ED Course/ Medical Decision Making/ A&P           HEART Score: 3                Medical Decision Making Amount and/or Complexity of Data Reviewed Labs: ordered.    Details: Labs are unremarkable besides slight hyperglycemia.  Troponins are negative x 2. Radiology: ordered and independent interpretation performed.    Details: No pneumothorax on x-ray ECG/medicine tests: independent interpretation performed.    Details: RBBB is unchanged from baseline.  No acute ischemia  Risk OTC drugs.   Patient's chest pain is pretty atypical and was very brief while in the bathroom.  While he does have a family history of his sister having an MI at a young age, otherwise he is relatively low risk.  His heart score is a 3.  ECG shows no acute ischemia and troponins are negative x 2.  No recurrence of symptoms.   At this point, I discussed options with patient and we will have him follow-up with his cardiologist and I do not think he would necessarily benefit from an admission/observation.  We did discuss return precautions and the limitations of ER workup but otherwise he is stable for discharge.  Low suspicion for ACS, PE, dissection, etc.     Final Clinical Impression(s) / ED Diagnoses Final diagnoses:  Nonspecific chest pain    Rx / DC Orders ED Discharge Orders          Ordered  Ambulatory referral to Cardiology       Comments: If you have not heard from the Cardiology office within the next 72 hours please call [phone].   03/01/22 [id number]              Sherwood Gambler, MD 03/01/22 [place], MD 03/01/22 1001

## 2022-03-02 ENCOUNTER — Telehealth: Payer: Self-pay

## 2022-03-02 NOTE — Telephone Encounter (Signed)
I called pt. Per pt. Ping report he was recently in the ED for chest pain. I had to LM to check on the pt. And see how he is doing now and to see if he needed to f/u here.

## 2022-03-09 ENCOUNTER — Encounter: Payer: Self-pay | Admitting: Cardiology

## 2022-03-09 ENCOUNTER — Ambulatory Visit (INDEPENDENT_AMBULATORY_CARE_PROVIDER_SITE_OTHER): Payer: No Typology Code available for payment source | Admitting: Cardiology

## 2022-03-09 VITALS — BP 150/74 | HR 60 | Ht 70.0 in | Wt 178.0 lb

## 2022-03-09 DIAGNOSIS — R0789 Other chest pain: Secondary | ICD-10-CM | POA: Diagnosis not present

## 2022-03-09 DIAGNOSIS — E782 Mixed hyperlipidemia: Secondary | ICD-10-CM | POA: Diagnosis not present

## 2022-03-09 DIAGNOSIS — Z7901 Long term (current) use of anticoagulants: Secondary | ICD-10-CM

## 2022-03-09 DIAGNOSIS — Z86711 Personal history of pulmonary embolism: Secondary | ICD-10-CM | POA: Diagnosis not present

## 2022-03-09 NOTE — Progress Notes (Signed)
Cardiology Office Note:    Date:  03/09/2022   ID:  Jonathan Archer, DOB 1971/05/27, MRN [id number]  PCP:  Jonathan Nian, MD  Cardiologist:  Jonathan Brothers, MD   Referring MD: Jonathan Loveless, MD    ASSESSMENT:    1. Atypical chest pain   2. Current use of long term anticoagulation   3. History of pulmonary embolism   4. Mixed dyslipidemia    PLAN:    In order of problems listed above:  Primary prevention stressed with the patient.  Importance of compliance with diet medication stressed any vocalized understanding. Chest pain: Atypical for coronary etiology and I reassured the patient.  A few years ago his calcium score was 0.  With ambulation he has no symptoms at all.  His symptoms have completely resolved. History of pulmonary embolism on anticoagulation: I discussed this with him.  Benefits and potential risks of anticoagulation explained and he vocalized understanding and questions were answered to satisfaction. Elevated blood pressure without diagnosis of hypertension: He is a little stressed today because of several issues at his home and work.  He has an element of whitecoat hypertension also.  He is going to monitor his blood pressure and get back to [place]. Mixed dyslipidemia: Diet emphasized.  This is mild in nature.  He promises to do better. Patient will be seen in follow-up appointment in 6 months or earlier if the patient has any concerns    Medication Adjustments/Labs and Tests Ordered: Current medicines are reviewed at length with the patient today.  Concerns regarding medicines are outlined above.  No orders of the defined types were placed in this encounter.  No orders of the defined types were placed in this encounter.    No chief complaint on file.    History of Present Illness:    Jonathan Archer is a 51 y.o. male.  Patient has past medical history of pulmonary embolism not on anticoagulation, mild dyslipidemia.  He denies any problems at this time and  takes care of activities of daily living.  No chest pain orthopnea or PND.  Time he is an active gentleman.  He mentions to me that when using the bathroom a few weeks ago he developed a stabbing chest pain.  Subsequently his chest was sore for 2 to 3 days and then after that he has had no issues.  No chest pain orthopnea or PND.  At the time of my evaluation, the patient is alert awake oriented and in no distress.  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   Multiple Vitamin (MULTIVITAMIN) tablet Take 1 tablet by mouth daily.   nitroGLYCERIN (NITROSTAT) 0.4 MG SL tablet Place 0.4 mg under the tongue every 5 (five) minutes as needed for chest pain.   ranitidine (ZANTAC) 75 MG tablet Take 75 mg by mouth as needed for heartburn.     Allergies:   Vicodin [hydrocodone-acetaminophen]   Social History   Socioeconomic History   Marital status: Single    Spouse name: Not on file   Number of children: 3   Years of education: college   Highest education level: Not on file  Occupational History    Occupation: sales  Tobacco Use   Smoking status: Never   Smokeless tobacco: Never  Vaping Use   Vaping Use: Never used  Substance and Sexual Activity   Alcohol use: Yes    Comment: rarely   Drug use: Yes    Types: Marijuana    Comment: small amount daily   Sexual activity: Not on file  Other Topics Concern   Not on file  Social History Narrative   Lives at home with his son.   Left-sided.   1-2 cups of Pepsi per day.   Social Determinants of Health   Financial Resource Strain: Not on file  Food Insecurity: Not on file  Transportation Needs: Not on file  Physical Activity: Not on file  Stress: Not on file  Social Connections: Not on file     Family History: The patient's family history includes Clotting disorder in his father, maternal grandmother, and paternal grandmother; Heart attack in his father, mother, and sister; Heart disease in his father, mother, and sister.  ROS:   Please see the history of present illness.    All other systems reviewed and are negative.  EKGs/Labs/Other Studies Reviewed:    The following studies were reviewed today: I discussed my findings with the patient at length.  EKG revealed sinus rhythm and nonspecific ST-T changes   Recent Labs: 02/07/2022: ALT 19 03/01/2022: BUN 14; Creatinine, Ser 1.20; Hemoglobin 14.3; Platelets 232; Potassium 3.8; Sodium 138  Recent Lipid Panel    Component Value Date/Time   CHOL 182 02/07/2022 1527   TRIG 133 02/07/2022 1527   HDL 39 (L) 02/07/2022 1527   CHOLHDL 4.7 02/07/2022 1527   CHOLHDL 3.6 01/12/2015 0001   VLDL 13 01/12/2015 0001   LDLCALC 119 (H) 02/07/2022 1527    Physical Exam:    VS:  BP (!) 150/74   Pulse 60   Ht 5\' 10"  (1.778 m)   Wt 178 lb (80.7 kg)   SpO2 99%   BMI 25.54 kg/m     Wt Readings from Last 3 Encounters:  03/09/22 178 lb (80.7 kg)  03/01/22 185 lb (83.9 kg)  02/07/22 188 lb (85.3 kg)     GEN: Patient is in no acute distress HEENT: Normal NECK: No JVD; No  carotid bruits LYMPHATICS: No lymphadenopathy CARDIAC: Hear sounds regular, 2/6 systolic murmur at the apex. RESPIRATORY:  Clear to auscultation without rales, wheezing or rhonchi  ABDOMEN: Soft, non-tender, non-distended MUSCULOSKELETAL:  No edema; No deformity  SKIN: Warm and dry NEUROLOGIC:  Alert and oriented x 3 PSYCHIATRIC:  Normal affect   Signed, 02/09/22, MD  03/09/2022 3:34 PM    [facility]

## 2022-03-09 NOTE — Patient Instructions (Signed)

## 2022-03-17 ENCOUNTER — Telehealth: Payer: Self-pay

## 2022-03-17 NOTE — Telephone Encounter (Signed)
   Pre-operative Risk Assessment    Patient Name: Jonathan Archer  DOB: 1971/03/12 MRN: [id number]      Request for Surgical Clearance    Procedure:   Left knee scope with chondroplasty of microfracture  Date of Surgery:  Clearance TBD                                 Surgeon:  Dr. Benny Lennert Surgeon's Group or Practice Name:  [facility] Phone number:  [phone] Fax number:  [phone]   Type of Clearance Requested:   - Medical    Type of Anesthesia:  Not Indicated   Additional requests/questions:      Kathie Dike   03/17/2022, 3:45 PM

## 2022-03-21 NOTE — Telephone Encounter (Signed)
Original request did not ask for anticoagulation recommendations.  Surgeon's office now calling for anticoagulation recommendations.    Will route to PharmD for rec's re: holding anticoagulation. Tereso Newcomer, PA-C    03/21/2022 3:32 PM

## 2022-03-21 NOTE — Telephone Encounter (Signed)
Notes faxed to surgeon. This phone note will be removed from the preop pool. Tereso Newcomer, PA-C  03/21/2022 11:53 AM

## 2022-03-21 NOTE — Telephone Encounter (Addendum)
   Patient seen by Dr. Tomie China 03/09/2022 in the office. Please see notes below from Dr. Tomie China.  Patient may proceed with procedure as planned.      Tereso Newcomer, PA-C    03/21/2022 11:51 AM

## 2022-03-22 NOTE — Telephone Encounter (Signed)
Patient with diagnosis of Prior VTE on Eliquis for anticoagulation.    Procedure: left knee scope with chondroplasty of microfracture Date of procedure: TBD  CrCl 83 Platelet count 232   History of provoked VTE 2019 (provoked - minor arthroscopic knee surgery)   Per office protocol, patient can hold Eliquis for 1 days prior to procedure.   Patient will not need bridging with Lovenox (enoxaparin) around procedure.  Please be sure to resume therapeutic dose of Eliquis as soon as allowed by surgeon  **This guidance is not considered finalized until pre-operative APP has relayed final recommendations.**

## 2022-03-22 NOTE — Telephone Encounter (Signed)
Notes faxed to surgeon. This phone note will be removed from the preop pool. Tereso Newcomer, PA-C  03/22/2022 4:56 PM

## 2022-03-22 NOTE — Telephone Encounter (Signed)
   See notes from PharmD regarding holding anticoagulation.  Per office protocol, patient can hold Eliquis for 1 days prior to procedure.  Patient will not need bridging with Lovenox (enoxaparin) around procedure. Please be sure to resume therapeutic dose of Eliquis as soon as allowed by surgeon  Tereso Newcomer, PA-C    03/22/2022 4:55 PM

## 2022-03-29 ENCOUNTER — Encounter: Payer: Self-pay | Admitting: Internal Medicine

## 2022-04-26 ENCOUNTER — Encounter (HOSPITAL_BASED_OUTPATIENT_CLINIC_OR_DEPARTMENT_OTHER): Payer: Self-pay | Admitting: Specialist

## 2022-04-26 NOTE — H&P (View-Only) (Signed)
Spoke w/ via phone for pre-op interview---pt Lab needs dos----   no            Lab results------ current ekg in epic/ chart COVID test -----patient states asymptomatic no test needed Arrive at ------- 1215 in 05-09-2022 NPO after MN NO Solid Food.  Clear liquids from MN until--- 1115 Med rec completed Medications to take morning of surgery ----- zyrtec, zantac Diabetic medication ----- n/a Patient instructed no nail polish to be worn day of surgery Patient instructed to bring photo id and insurance card day of surgery Patient aware to have Driver (ride ) / caregiver   for 24 hours after surgery -- sig other, christine Patient Special Instructions ----- n/a Pre-Op special Istructions ----- pt has cardiac clearance by Scott Weaver PA on 03-22-2022 in epic/ chart Patient verbalized understanding of instructions that were given at this phone interview. Patient denies shortness of breath, chest pain, fever, cough at this phone interview.    Anesthesia Review:  hx atypical chest pain, w/ negative work-up by cardiology (pt stated has never taken a nitro);   hx recurrent PE/ LLE DVT twice last one 10/ 2019 , provoked post op knee surgery, work-up negative for clotting disorder  PCP: Dr Lalonde (lov 02-07-2022 epic) Cardiologist : Dr Revankar (lov 03-09-2022 epic) Chest x-ray : 03-01-2022 EKG : 03-09-2022 Echo : 12-02-2019 Stress test: nuclear 11-14-2021 Coronary CT :  08-03-2019 Cardiac Cath : no Activity level:  pt stated gets sob w/ stairs , ok with yard / house work  Blood Thinner/ Instructions /Last Dose: Eliquis ASA / Instructions/ Last Dose :  no Per pt given instructions by cardiologist to stop one day prior to surgery, stated last dose will be Sunday evening 05-07-2022 

## 2022-04-26 NOTE — Progress Notes (Signed)
Spoke w/ via phone for pre-op interview---pt Lab needs dos----   no            Lab results------ current ekg in epic/ chart COVID test -----patient states asymptomatic no test needed Arrive at ------- 1215 in 05-09-2022 NPO after MN NO Solid Food.  Clear liquids from MN until--- 1115 Med rec completed Medications to take morning of surgery ----- zyrtec, zantac Diabetic medication ----- n/a Patient instructed no nail polish to be worn day of surgery Patient instructed to bring photo id and insurance card day of surgery Patient aware to have Driver (ride ) / caregiver   for 24 hours after surgery -- sig other, christine Patient Special Instructions ----- n/a Pre-Op special Istructions ----- pt has cardiac clearance by Richardson Dopp PA on 03-22-2022 in epic/ chart Patient verbalized understanding of instructions that were given at this phone interview. Patient denies shortness of breath, chest pain, fever, cough at this phone interview.    Anesthesia Review:  hx atypical chest pain, w/ negative work-up by cardiology (pt stated has never taken a nitro);   hx recurrent PE/ LLE DVT twice last one 10/ 2019 , provoked post op knee surgery, work-up negative for clotting disorder  PCP: Dr Redmond School (lov 02-07-2022 epic) Cardiologist : Dr Geraldo Pitter Cassell Clement 03-09-2022 epic) Chest x-ray : 03-01-2022 EKG : 03-09-2022 Echo : 12-02-2019 Stress test: nuclear 11-14-2021 Coronary CT :  08-03-2019 Cardiac Cath : no Activity level:  pt stated gets sob w/ stairs , ok with yard / house work  Blood Thinner/ Instructions Maryjane Hurter Dose: Eliquis ASA / Instructions/ Last Dose :  no Per pt given instructions by cardiologist to stop one day prior to surgery, stated last dose will be Sunday evening 05-07-2022

## 2022-05-02 ENCOUNTER — Encounter: Payer: Self-pay | Admitting: Internal Medicine

## 2022-05-09 ENCOUNTER — Encounter (HOSPITAL_BASED_OUTPATIENT_CLINIC_OR_DEPARTMENT_OTHER): Payer: Self-pay | Admitting: Specialist

## 2022-05-09 ENCOUNTER — Ambulatory Visit (HOSPITAL_BASED_OUTPATIENT_CLINIC_OR_DEPARTMENT_OTHER): Payer: Self-pay | Admitting: Anesthesiology

## 2022-05-09 ENCOUNTER — Encounter (HOSPITAL_BASED_OUTPATIENT_CLINIC_OR_DEPARTMENT_OTHER)
Admission: RE | Disposition: A | Discharge status: Home / Self Care | Payer: Self-pay | Source: Home / Self Care | Attending: Specialist

## 2022-05-09 ENCOUNTER — Other Ambulatory Visit: Payer: Self-pay

## 2022-05-09 ENCOUNTER — Ambulatory Visit (HOSPITAL_BASED_OUTPATIENT_CLINIC_OR_DEPARTMENT_OTHER)
Admission: RE | Admit: 2022-05-09 | Discharge: 2022-05-09 | Disposition: A | Discharge status: Home / Self Care | Payer: Self-pay | Attending: Specialist | Admitting: Specialist

## 2022-05-09 DIAGNOSIS — K219 Gastro-esophageal reflux disease without esophagitis: Secondary | ICD-10-CM | POA: Insufficient documentation

## 2022-05-09 DIAGNOSIS — M1712 Unilateral primary osteoarthritis, left knee: Secondary | ICD-10-CM

## 2022-05-09 DIAGNOSIS — M21962 Unspecified acquired deformity of left lower leg: Secondary | ICD-10-CM

## 2022-05-09 DIAGNOSIS — Z86718 Personal history of other venous thrombosis and embolism: Secondary | ICD-10-CM | POA: Insufficient documentation

## 2022-05-09 DIAGNOSIS — Z86711 Personal history of pulmonary embolism: Secondary | ICD-10-CM | POA: Insufficient documentation

## 2022-05-09 DIAGNOSIS — M25562 Pain in left knee: Secondary | ICD-10-CM | POA: Insufficient documentation

## 2022-05-09 DIAGNOSIS — Z01818 Encounter for other preprocedural examination: Secondary | ICD-10-CM

## 2022-05-09 DIAGNOSIS — M948X6 Other specified disorders of cartilage, lower leg: Secondary | ICD-10-CM | POA: Insufficient documentation

## 2022-05-09 HISTORY — DX: Other chronic pain: G89.29

## 2022-05-09 HISTORY — DX: Dermatitis, unspecified: L30.9

## 2022-05-09 HISTORY — DX: Unspecified osteoarthritis, unspecified site: M19.90

## 2022-05-09 HISTORY — DX: Long term (current) use of anticoagulants: Z79.01

## 2022-05-09 HISTORY — PX: KNEE ARTHROSCOPY WITH DRILLING/MICROFRACTURE: SHX6425

## 2022-05-09 HISTORY — DX: Personal history of other diseases of the musculoskeletal system and connective tissue: Z87.39

## 2022-05-09 HISTORY — DX: Shortness of breath: R06.02

## 2022-05-09 HISTORY — DX: Personal history of other venous thrombosis and embolism: Z86.718

## 2022-05-09 HISTORY — DX: Gastro-esophageal reflux disease without esophagitis: K21.9

## 2022-05-09 HISTORY — DX: Presence of spectacles and contact lenses: Z97.3

## 2022-05-09 LAB — COMPREHENSIVE METABOLIC PANEL
ALT: 19 U/L (ref 0–44)
AST: 22 U/L (ref 15–41)
Albumin: 4.4 g/dL (ref 3.5–5.0)
Alkaline Phosphatase: 66 U/L (ref 38–126)
Anion gap: 9 (ref 5–15)
BUN: 11 mg/dL (ref 6–20)
CO2: 23 mmol/L (ref 22–32)
Calcium: 9.1 mg/dL (ref 8.9–10.3)
Chloride: 107 mmol/L (ref 98–111)
Creatinine, Ser: 1.33 mg/dL — ABNORMAL HIGH (ref 0.61–1.24)
GFR, Estimated: >60 mL/min (ref >60)
Glucose, Bld: 91 mg/dL (ref 70–99)
Potassium: 4 mmol/L (ref 3.5–5.1)
Sodium: 139 mmol/L (ref 135–145)
Total Bilirubin: 0.6 mg/dL (ref 0.3–1.2)
Total Protein: 7.6 g/dL (ref 6.5–8.1)

## 2022-05-09 LAB — CBC
HCT: 43 % (ref 39.0–52.0)
Hemoglobin: 14.6 g/dL (ref 13.0–17.0)
MCH: 29.7 pg (ref 26.0–34.0)
MCHC: 34 g/dL (ref 30.0–36.0)
MCV: 87.4 fL (ref 80.0–100.0)
Platelets: 234 10*3/uL (ref 150–400)
RBC: 4.92 MIL/uL (ref 4.22–5.81)
RDW: 12.8 % (ref 11.5–15.5)
WBC: 4.9 10*3/uL (ref 4.0–10.5)
nRBC: 0 % (ref 0.0–0.2)

## 2022-05-09 SURGERY — ARTHROSCOPY, KNEE, WITH ABRASION ARTHROPLASTY OR MICROFRACTURE
Anesthesia: General | Site: Knee | Laterality: Left

## 2022-05-09 MED ORDER — PROPOFOL 10 MG/ML IV BOLUS
INTRAVENOUS | Status: DC | PRN
  Administered 2022-05-09: 200 mg via INTRAVENOUS

## 2022-05-09 MED ORDER — BUPIVACAINE HCL 0.25 % IJ SOLN
INTRAMUSCULAR | Status: DC | PRN
  Administered 2022-05-09: 7 mL

## 2022-05-09 MED ORDER — FENTANYL CITRATE (PF) 100 MCG/2ML IJ SOLN
INTRAMUSCULAR | Status: DC | PRN
  Administered 2022-05-09 (×2): 50 ug via INTRAVENOUS
  Administered 2022-05-09 (×2): 25 ug via INTRAVENOUS
  Administered 2022-05-09: 50 ug via INTRAVENOUS

## 2022-05-09 MED ORDER — ACETAMINOPHEN 10 MG/ML IV SOLN
INTRAVENOUS | Status: AC
  Filled 2022-05-09: qty 100

## 2022-05-09 MED ORDER — CEFAZOLIN SODIUM-DEXTROSE 2-4 GM/100ML-% IV SOLN
INTRAVENOUS | Status: AC
  Filled 2022-05-09: qty 100

## 2022-05-09 MED ORDER — OXYCODONE HCL 5 MG PO TABS: 5.0000 mg | ORAL_TABLET | ORAL | 0 refills | Status: DC | PRN

## 2022-05-09 MED ORDER — ONDANSETRON HCL 4 MG PO TABS: 4.0000 mg | ORAL_TABLET | Freq: Every day | ORAL | 1 refills | Status: DC | PRN

## 2022-05-09 MED ORDER — MIDAZOLAM HCL 5 MG/5ML IJ SOLN
INTRAMUSCULAR | Status: DC | PRN
  Administered 2022-05-09: 2 mg via INTRAVENOUS

## 2022-05-09 MED ORDER — GLYCOPYRROLATE 0.2 MG/ML IJ SOLN
INTRAMUSCULAR | Status: DC | PRN
  Administered 2022-05-09: .2 mg via INTRAVENOUS

## 2022-05-09 MED ORDER — SODIUM CHLORIDE 0.9 % IR SOLN
Status: DC | PRN
  Administered 2022-05-09: 6000 mL

## 2022-05-09 MED ORDER — CEPHALEXIN 500 MG PO CAPS: 500.0000 mg | ORAL_CAPSULE | Freq: Four times a day (QID) | ORAL | 0 refills | Status: AC

## 2022-05-09 MED ORDER — METHOCARBAMOL 500 MG PO TABS: 500.0000 mg | ORAL_TABLET | Freq: Four times a day (QID) | ORAL | 0 refills | Status: DC

## 2022-05-09 MED ORDER — FENTANYL CITRATE (PF) 100 MCG/2ML IJ SOLN: 25.0000 ug | INTRAMUSCULAR | Status: DC | PRN

## 2022-05-09 MED ORDER — FENTANYL CITRATE (PF) 100 MCG/2ML IJ SOLN
INTRAMUSCULAR | Status: AC
  Filled 2022-05-09: qty 2

## 2022-05-09 MED ORDER — PROMETHAZINE HCL 25 MG/ML IJ SOLN: 6.2500 mg | INTRAMUSCULAR | Status: DC | PRN

## 2022-05-09 MED ORDER — ONDANSETRON HCL 4 MG/2ML IJ SOLN
INTRAMUSCULAR | Status: DC | PRN
  Administered 2022-05-09: 4 mg via INTRAVENOUS

## 2022-05-09 MED ORDER — DEXAMETHASONE SODIUM PHOSPHATE 4 MG/ML IJ SOLN
INTRAMUSCULAR | Status: DC | PRN
  Administered 2022-05-09: 5 mg via INTRAVENOUS

## 2022-05-09 MED ORDER — CEFAZOLIN SODIUM-DEXTROSE 2-4 GM/100ML-% IV SOLN
2.0000 g | INTRAVENOUS | Status: AC
  Administered 2022-05-09: 2 g via INTRAVENOUS

## 2022-05-09 MED ORDER — LACTATED RINGERS IV SOLN: INTRAVENOUS | Status: DC

## 2022-05-09 MED ORDER — ACETAMINOPHEN 10 MG/ML IV SOLN
1000.0000 mg | Freq: Once | INTRAVENOUS | Status: AC
  Administered 2022-05-09: 1000 mg via INTRAVENOUS

## 2022-05-09 MED ORDER — LIDOCAINE HCL (CARDIAC) PF 100 MG/5ML IV SOSY
PREFILLED_SYRINGE | INTRAVENOUS | Status: DC | PRN
  Administered 2022-05-09: 100 mg via INTRAVENOUS

## 2022-05-09 MED ORDER — MIDAZOLAM HCL 2 MG/2ML IJ SOLN
INTRAMUSCULAR | Status: AC
  Filled 2022-05-09: qty 2

## 2022-05-09 MED ORDER — AMISULPRIDE (ANTIEMETIC) 5 MG/2ML IV SOLN: 10.0000 mg | Freq: Once | INTRAVENOUS | Status: DC | PRN

## 2022-05-09 SURGICAL SUPPLY — 41 items
BANDAGE ESMARK 6X9 LF (GAUZE/BANDAGES/DRESSINGS) ×2 IMPLANT
BLADE EXCALIBUR 4.0X13 (MISCELLANEOUS) ×2 IMPLANT
BNDG CMPR 9X6 STRL LF SNTH (GAUZE/BANDAGES/DRESSINGS) ×1
BNDG ELASTIC 4X5.8 VLCR STR LF (GAUZE/BANDAGES/DRESSINGS) IMPLANT
BNDG ESMARK 6X9 LF (GAUZE/BANDAGES/DRESSINGS) ×1
BNDG GAUZE DERMACEA FLUFF 4 (GAUZE/BANDAGES/DRESSINGS) ×2 IMPLANT
BNDG GZE DERMACEA 4 6PLY (GAUZE/BANDAGES/DRESSINGS) ×1
DRAPE ARTHROSCOPY W/POUCH 114 (DRAPES) ×2 IMPLANT
DRAPE INCISE IOBAN 66X45 STRL (DRAPES) ×2 IMPLANT
DRAPE U-SHAPE 47X51 STRL (DRAPES) ×2 IMPLANT
DURAPREP 26ML APPLICATOR (WOUND CARE) ×2 IMPLANT
DW OUTFLOW CASSETTE/TUBE SET (MISCELLANEOUS) ×2 IMPLANT
EXCALIBUR 3.8MM X 13CM (MISCELLANEOUS) ×2 IMPLANT
GAUZE 4X4 16PLY ~~LOC~~+RFID DBL (SPONGE) ×2 IMPLANT
GAUZE PAD ABD 8X10 STRL (GAUZE/BANDAGES/DRESSINGS) IMPLANT
GAUZE SPONGE 4X4 12PLY STRL (GAUZE/BANDAGES/DRESSINGS) ×2 IMPLANT
GAUZE XEROFORM 1X8 LF (GAUZE/BANDAGES/DRESSINGS) ×2 IMPLANT
GLOVE BIO SURGEON STRL SZ7 (GLOVE) ×2 IMPLANT
GLOVE BIO SURGEON STRL SZ8 (GLOVE) ×2 IMPLANT
GLOVE BIOGEL PI IND STRL 6 (GLOVE) IMPLANT
GLOVE BIOGEL PI IND STRL 6.5 (GLOVE) IMPLANT
GLOVE BIOGEL PI IND STRL 7.5 (GLOVE) ×2 IMPLANT
GLOVE BIOGEL PI IND STRL 8 (GLOVE) ×2 IMPLANT
GLOVE ECLIPSE 6.0 STRL STRAW (GLOVE) IMPLANT
GOWN STRL REUS W/TWL LRG LVL3 (GOWN DISPOSABLE) IMPLANT
GOWN STRL REUS W/TWL XL LVL3 (GOWN DISPOSABLE) ×4 IMPLANT
HOLDER KNEE FOAM BLUE (MISCELLANEOUS) IMPLANT
IV NS IRRIG 3000ML ARTHROMATIC (IV SOLUTION) ×4 IMPLANT
KIT TURNOVER CYSTO (KITS) ×2 IMPLANT
MANIFOLD NEPTUNE II (INSTRUMENTS) ×2 IMPLANT
NEEDLE HYPO 22GX1.5 SAFETY (NEEDLE) ×2 IMPLANT
PACK ARTHROSCOPY DSU (CUSTOM PROCEDURE TRAY) ×2 IMPLANT
PACK BASIN DAY SURGERY FS (CUSTOM PROCEDURE TRAY) ×2 IMPLANT
PADDING CAST ABS COTTON 3X4 (CAST SUPPLIES) IMPLANT
SUT ETHILON 4 0 PS 2 18 (SUTURE) ×2 IMPLANT
SYR CONTROL 10ML LL (SYRINGE) ×2 IMPLANT
TOWEL OR 17X26 10 PK STRL BLUE (TOWEL DISPOSABLE) ×4 IMPLANT
TUBE CONNECTING 12X1/4 (SUCTIONS) ×2 IMPLANT
TUBING ARTHROSCOPY IRRIG 16FT (MISCELLANEOUS) ×2 IMPLANT
WAND APOLLORF SJ50 AR-9845 (SURGICAL WAND) ×2 IMPLANT
WRAP KNEE MAXI GEL POST OP (GAUZE/BANDAGES/DRESSINGS) IMPLANT

## 2022-05-09 NOTE — Interval H&P Note (Signed)
History and Physical Interval Note:  05/09/2022 2:30 PM  Jonathan Archer  has presented today for surgery, with the diagnosis of Left knee multiple articular cartilage defects.  The various methods of treatment have been discussed with the patient and family. After consideration of risks, benefits and other options for treatment, the patient has consented to  Procedure(s): KNEE ARTHROSCOPY WITH CHONDROPLASTY , MICROFRACTURE (Left) as a surgical intervention.  The patient's history has been reviewed, patient examined, no change in status, stable for surgery.  I have reviewed the patient's chart and labs.  Questions were answered to the patient's satisfaction.     Soliyana Mcchristian ANDREW

## 2022-05-09 NOTE — Transfer of Care (Signed)
Immediate Anesthesia Transfer of Care Note  Patient: Jonathan Archer  Procedure(s) Performed: Procedure(s) (LRB): KNEE ARTHROSCOPY WITH CHONDROPLASTY , MICROFRACTURE (Left)  Patient Location: PACU  Anesthesia Type: General  Level of Consciousness: awake, sedated, patient cooperative and responds to stimulation  Airway & Oxygen Therapy: Patient Spontanous Breathing and Patient connected to [place] oxygen  Post-op Assessment: Report given to PACU RN, Post -op Vital signs reviewed and stable and Patient moving all extremities  Post vital signs: Reviewed and stable  Complications: No apparent anesthesia complications

## 2022-05-09 NOTE — Anesthesia Procedure Notes (Signed)
Procedure Name: LMA Insertion Date/Time: 05/09/2022 2:39 PM  Performed by: Justice Rocher, CRNAPre-anesthesia Checklist: Patient identified, Emergency Drugs available, Suction available, Patient being monitored and Timeout performed Patient Re-evaluated:Patient Re-evaluated prior to induction Oxygen Delivery Method: Circle system utilized Preoxygenation: Pre-oxygenation with 100% oxygen Induction Type: IV induction Ventilation: Mask ventilation without difficulty LMA: LMA inserted LMA Size: 5.0 Number of attempts: 1 Airway Equipment and Method: Bite block Placement Confirmation: positive ETCO2, breath sounds checked- equal and bilateral and CO2 detector Tube secured with: Tape Dental Injury: Teeth and Oropharynx as per pre-operative assessment

## 2022-05-09 NOTE — Discharge Instructions (Addendum)
Left knee scope chondroplasty, microfracture: -Please take aspirin 81 mg twice a day 1 in the morning 1 in the evening -Okay to take Tylenol 1000 mg 2-3 times a day with pain medication -Please ice and elevate the leg -Okay to start physical therapy 1 week after surgery -Please follow-up in the office in 2 weeks -Please take a stool softener while taking opioid pain medication -Please take a probiotic while taking antibiotics for 3 days -Okay to remove bandages in 3 days and replace with bandaids, keep incision site clean  - toe touch weightbearing      Post Anesthesia Home Care Instructions  Activity: Get plenty of rest for the remainder of the day. A responsible individual must stay with you for 24 hours following the procedure.  For the next 24 hours, DO NOT: -Drive a car -Paediatric nurse -Drink alcoholic beverages -Take any medication unless instructed by your physician -Make any legal decisions or sign important papers.  Meals: Start with liquid foods such as gelatin or soup. Progress to regular foods as tolerated. Avoid greasy, spicy, heavy foods. If nausea and/or vomiting occur, drink only clear liquids until the nausea and/or vomiting subsides. Call your physician if vomiting continues.  Special Instructions/Symptoms: Your throat may feel dry or sore from the anesthesia or the breathing tube placed in your throat during surgery. If this causes discomfort, gargle with warm salt water. The discomfort should disappear within 24 hours.

## 2022-05-09 NOTE — Op Note (Signed)
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 Postop diagnosis #1 left knee 2 cm x 3 cm focal chondral defect full-thickness femoral trochlea, 2 cm x 1.5 cm full-thickness chondral defect medial femoral condyle, 1 cm x 1 cm full-thickness focal chondral defect lateral femoral condyle. Procedure left knee arthroscopic chondroplasty with microfracture to the femoral trochlea, medial femoral condyle, lateral femoral condyle. Surgeon Hart Robinsons, MD Assistant Elenor Legato, PA-C Anesthesia General Estimated blood loss minimal Complications none Tourniquet time none Disposition PACU stable. Drains none  Operative details Patient was counseled in the holding area.  Correct side marked and signed appropriately.  IV antibiotics were given within 1 hour of the surgical incision time.  Taken to the OR placed in supine position under general anesthesia.  No block was administered preoperatively.  Left thigh was placed in thigh holder prepped with DuraPrep and draped into a sterile fashion.  After timeout arthroscopic portals were established inferomedial inferolateral.  Diagnostic arthroscopy at the patellofemoral joint revealed minimal chondromalacia the patella there was a full-thickness focal chondral defect of the femoral trochlea 2 cm x 3 cm.  Utilizing standard Stedman technique it was shaved back to well shouldered circumferential areas.  Calcified cartilage layer was removed utilizing ring curette.  The microfracture pick was then utilized to perform a microfracture appropriate spacing each defect.  Compression was decreased we confirmed bleeding from the PICC sites.  ACL and PCL were intact medial compartment was inspected medial femoral condyle showed a 2 cm x 1.5 cm full-thickness chondral defect of the medial femoral condyle identical technique was performed well shouldered calcified cartilage layer removed and microfracture pick was  used to perform the punctate defects.  The knee was confirmed.  Suprapatellar pouch medial gutters unremarkable of the medial compartment also the articular cartilage of the mid femoral condyle was otherwise intact he is medial tibial plateau intact medial meniscus was intact.  Lateral side was inspected lateral to plateau unremarkable lateral meniscus was intact lateral femoral condyle did have a 1 x 1 cm full-thickness defect in the lateral femoral condyle again identical technique was performed shaved chondroplasty well shoulder calcified cartilage layer removed microfracture pick was utilized performed punctate defects, pressure decreased and confirmed bleeding.  No other abnormalities were noted.  Irrigated arthroscopic fluid was removed.  10 cc of 0.25% Marcaine was placed to the skin only making sure not to place any is out of the joint.  Sterile dressing was applied compressive wrap taken with room to PACU in stable condition.  No complications or problems.  He will be started on physical therapy next week.  Return to obtain a CPM machine today or tomorrow he will be allowed toe-touch weightbearing only and range of motion 0-30 then 0-70 as tolerated 10 degrees/day.

## 2022-05-09 NOTE — Anesthesia Preprocedure Evaluation (Addendum)
Anesthesia Evaluation  Patient identified by MRN, date of birth, ID band Patient awake    Reviewed: Allergy & Precautions, NPO status , Patient's Chart, lab work & pertinent test results  Airway Mallampati: II  TM Distance: >3 FB Neck ROM: Full    Dental no notable dental hx. (+) Dental Advisory Given   Pulmonary PE Hx of PE 2007 due to DVT post knee surgery    Pulmonary exam normal        Cardiovascular negative cardio ROS Normal cardiovascular exam  normal nuclear stress test 11-14-2021: .  Findings are consistent with no prior ischemia and no prior myocardial infarction. The study is low risk. .  No ST deviation was noted. .  LV perfusion is normal. .  Left ventricular function is abnormal. Global function is mildly reduced. Nuclear stress EF: 53 %. The left ventricular ejection fraction is mildly decreased (45-54%). End diastolic cavity size is mildly enlarged. .  Prior study available for comparison from 05/16/2018. No significant change from prior study.    normal echo 12-02-2019, and normal coronary CT with calcium score zero w/ no evidence cad cone 08-03-2019     Neuro/Psych negative neurological ROS     GI/Hepatic Neg liver ROS, GERD  Medicated,  Endo/Other  negative endocrine ROS  Renal/GU negative Renal ROS     Musculoskeletal   Abdominal   Peds  Hematology negative hematology ROS (+)   Anesthesia Other Findings   Reproductive/Obstetrics                           Anesthesia Physical Anesthesia Plan  ASA: 2  Anesthesia Plan: General   Post-op Pain Management: Tylenol PO (pre-op)*   Induction: Intravenous  PONV Risk Score and Plan: 3 and Ondansetron, Dexamethasone and Midazolam  Airway Management Planned: LMA  Additional Equipment:   Intra-op Plan:   Post-operative Plan:   Informed Consent: I have reviewed the patients History and Physical, chart, labs and  discussed the procedure including the risks, benefits and alternatives for the proposed anesthesia with the patient or authorized representative who has indicated his/her understanding and acceptance.     Dental advisory given  Plan Discussed with: Anesthesiologist and CRNA  Anesthesia Plan Comments:        Anesthesia Quick Evaluation

## 2022-05-09 NOTE — H&P (Signed)
Jonathan Archer is an 51 y.o. male.   Chief Complaint: Left knee pain HPI: Pleasant 52 year old male who has been having left knee pain for many months now. He is also complaining of mechanical symptoms. He has tried conservative management with minimal relief. He has had a previous ACL reconstruction. MRI scan does not show any meniscus tear but does show chondral flaps and places of cartilage loss.   Past Medical History:  Diagnosis Date   Allergic rhinitis due to pollen 01/12/2015   Atypical chest pain 06/24/2018   cardiologist-- dr Tomie China;  work-up done , normal nuclear stress test 11-14-2021, normal echo 12-02-2019, and normal coronary CT with calcium score zero w/ no evidence cad cone 08-03-2019   Chronic pain of left knee    Current long-term use of anticoagulant medication with history of deep venous thrombosis (DVT)    (takes eliquis, managed by cardiology)  hx negative work-up for clotting disorder by dr Mosetta Putt 05/ 2020//   recurrent first time LLE DVT 01/31/ 2007 provoked post op left arthroscopy w/ PE;  second time 10/ 04/ 2019 10 months post op left knee ACL/ MCL repairs LLE 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  (seen by vascular dr Jasmine Awe 12-18-2018)   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   Eczema    ED (erectile dysfunction)    Family history of heart disease in male family member before age 53 04/12/2018   GERD (gastroesophageal reflux disease)    History of Osgood-Schlatter disease    right lower extremity,  s/p bone removal in 1991   History of pulmonary embolus (PE) 07/2005   recurrent twice,  first time 01/ 2007 one week s/p left knee arthroscopy right lung w/ LLE DVT;  second time 10 months (10/ 2019)  post op left ACL/ MCL repairs on 12/ 2018 right lung PE and LLE DVT   Hypogonadism male    Incomplete RBBB    Mallet finger of left hand 06/28/2021   Mixed dyslipidemia 04/24/2018   OA (osteoarthritis)    left hip and right thumb   SOB (shortness of  breath) on exertion    per pt w/ stairs when stop recovers quickly  (ok w/ yard and house hold work)   Wears glasses     Past Surgical History:  Procedure Laterality Date   ARTHROSCOPIC REPAIR ACL Left 06/2017   and MCL repair   KNEE ARTHROSCOPY Left    1996;  1998;  01/ 2007   KNEE SURGERY Right 1991   removal of bone (shin)    Family History  Problem Relation Age of Onset   Heart disease Sister    Heart attack Sister    Heart disease Mother    Heart attack Mother    Heart disease Father    Heart attack Father    Clotting disorder Father    Clotting disorder Maternal Grandmother    Clotting disorder Paternal Grandmother    Social History:  reports that he has never smoked. He has never used smokeless tobacco. He reports that he does not currently use alcohol. He reports current drug use. Drug: Marijuana.  Allergies:  Allergies  Allergen Reactions   Hydrocodone Other (See Comments)    "Eyes burn"    Medications Prior to Admission  Medication Sig Dispense Refill   acetaminophen (TYLENOL) 500 MG tablet Take 1,000 mg by mouth every 6 (six) hours as needed.     apixaban (ELIQUIS) 5 MG TABS tablet TAKE  1 TABLET BY MOUTH TWICE DAILY (Patient taking differently: Take 5 mg by mouth 2 (two) times daily.) 60 tablet 5   cetirizine (ZYRTEC) 10 MG tablet Take 10 mg by mouth daily.     Multiple Vitamin (MULTIVITAMIN) tablet Take 1 tablet by mouth daily.     ranitidine (ZANTAC) 75 MG tablet Take 75 mg by mouth as needed for heartburn.     nitroGLYCERIN (NITROSTAT) 0.4 MG SL tablet Place 0.4 mg under the tongue every 5 (five) minutes as needed for chest pain.      Results for orders placed or performed during the hospital encounter of 05/09/22 (from the past 48 hour(s))  CBC     Status: None   Collection Time: 05/09/22 12:15 PM  Result Value Ref Range   WBC 4.9 4.0 - 10.5 K/uL   RBC 4.92 4.22 - 5.81 MIL/uL   Hemoglobin 14.6 13.0 - 17.0 g/dL   HCT 06.3 01.6 - 01.0 %   MCV 87.4  80.0 - 100.0 fL   MCH 29.7 26.0 - 34.0 pg   MCHC 34.0 30.0 - 36.0 g/dL   RDW 93.2 35.5 - 73.2 %   Platelets 234 150 - 400 K/uL   nRBC 0.0 0.0 - 0.2 %    Comment: Performed at [facility], [address], [place], [place] [address]  Comprehensive metabolic panel     Status: Abnormal   Collection Time: 05/09/22 12:15 PM  Result Value Ref Range   Sodium 139 135 - 145 mmol/L   Potassium 4.0 3.5 - 5.1 mmol/L   Chloride 107 98 - 111 mmol/L   CO2 23 22 - 32 mmol/L   Glucose, Bld 91 70 - 99 mg/dL    Comment: Glucose reference range applies only to samples taken after fasting for at least 8 hours.   BUN 11 6 - 20 mg/dL   Creatinine, Ser 2.70 (H) 0.61 - 1.24 mg/dL   Calcium 9.1 8.9 - 62.3 mg/dL   Total Protein 7.6 6.5 - 8.1 g/dL   Albumin 4.4 3.5 - 5.0 g/dL   AST 22 15 - 41 U/L   ALT 19 0 - 44 U/L   Alkaline Phosphatase 66 38 - 126 U/L   Total Bilirubin 0.6 0.3 - 1.2 mg/dL   GFR, Estimated >76 >28 mL/min    Comment: (NOTE) Calculated using the CKD-EPI Creatinine Equation (2021)    Anion gap 9 5 - 15    Comment: Performed at [facility], [address], [place], [place] [address]   No results found.  Review of Systems  All other systems reviewed and are negative.   Blood pressure (!) 141/93, pulse 62, temperature 98.1 F (36.7 C), temperature source Oral, resp. rate 17, height 5\' 10"  (1.778 m), weight 83.4 kg, SpO2 98 %. Physical Exam Constitutional:      Appearance: Normal appearance.  HENT:     Head: Normocephalic and atraumatic.  Musculoskeletal:     Comments:  On examination of the left knee reveals he has a positive medial McMurray's, mild tenderness posterolaterally and posteromedial also. A little bit in the midline, posteriorly. No palpable masses. Negative Lachman, negative anterior drawer.  Procedure Documentation   Neurological:     Mental Status: He is alert.      Assessment/Plan Left knee chondral loss: Patient has full  thickness chondral loss in his left knee. He has tried injections up until this point with minimal relief. Risks and benefits of surgical vs nonsurgical management  have been discussed. He has elected to proceed with surgical management. He will have a left knee scope, chondroplasty and microfracture.   Drue Novel, PA 05/09/2022, 2:01 PM

## 2022-05-09 NOTE — Anesthesia Postprocedure Evaluation (Signed)
Anesthesia Post Note  Patient: Jonathan Archer  Procedure(s) Performed: KNEE ARTHROSCOPY WITH CHONDROPLASTY , MICROFRACTURE (Left: Knee)     Patient location during evaluation: PACU Anesthesia Type: General Level of consciousness: sedated Pain management: pain level controlled Vital Signs Assessment: post-procedure vital signs reviewed and stable Respiratory status: spontaneous breathing and respiratory function stable Cardiovascular status: stable Postop Assessment: no apparent nausea or vomiting Anesthetic complications: no   No notable events documented.  Last Vitals:  Vitals:   05/09/22 1615 05/09/22 1649  BP: (!) 138/97 (!) 152/96  Pulse: 65 62  Resp: 17 15  Temp:  36.7 C  SpO2: 96% 98%    Last Pain:  Vitals:   05/09/22 1649  TempSrc:   PainSc: 5                  Letecia Arps DANIEL

## 2022-05-10 ENCOUNTER — Encounter (HOSPITAL_BASED_OUTPATIENT_CLINIC_OR_DEPARTMENT_OTHER): Payer: Self-pay | Admitting: Specialist

## 2022-05-15 ENCOUNTER — Encounter: Payer: Self-pay | Admitting: Internal Medicine

## 2022-09-12 ENCOUNTER — Telehealth: Payer: Self-pay | Admitting: Cardiology

## 2022-09-12 DIAGNOSIS — I2782 Chronic pulmonary embolism: Secondary | ICD-10-CM

## 2022-09-12 MED ORDER — APIXABAN 5 MG PO TABS: 5.0000 mg | ORAL_TABLET | Freq: Two times a day (BID) | ORAL | 5 refills | Status: DC

## 2022-09-12 NOTE — Telephone Encounter (Signed)
Prescription refill request for Eliquis received. Indication: PE Last office visit: 03/09/22  R Revankar MD Scr: 1.33 on 05/09/22 Age: 52 Weight: 80.7kg  Based on above findings Eliquis 84m twice daily is the appropriate dose.  Refill approved.

## 2022-09-12 NOTE — Telephone Encounter (Signed)
*  STAT* If patient is at the pharmacy, call can be transferred to refill team.   1. Which medications need to be refilled? (please list name of each medication and dose if known) apixaban (ELIQUIS) 5 MG TABS tablet   2. Which pharmacy/location (including street and city if local pharmacy) is medication to be sent to?  [id number] - [place], [place] - [address]    3. Do they need a 30 day or 90 day supply? 30 day

## 2022-09-14 ENCOUNTER — Other Ambulatory Visit: Payer: Self-pay | Admitting: Cardiology

## 2022-09-14 DIAGNOSIS — I2782 Chronic pulmonary embolism: Secondary | ICD-10-CM

## 2022-09-14 NOTE — Telephone Encounter (Signed)
Refill sent on 09/12/22 by Edrick Oh, RN

## 2022-09-20 DIAGNOSIS — S62639A Displaced fracture of distal phalanx of unspecified finger, initial encounter for closed fracture: Secondary | ICD-10-CM

## 2022-09-20 HISTORY — DX: Displaced fracture of distal phalanx of unspecified finger, initial encounter for closed fracture: S62.639A

## 2022-11-17 ENCOUNTER — Other Ambulatory Visit: Payer: Self-pay

## 2022-11-17 DIAGNOSIS — Z7901 Long term (current) use of anticoagulants: Secondary | ICD-10-CM | POA: Insufficient documentation

## 2022-11-17 DIAGNOSIS — M1990 Unspecified osteoarthritis, unspecified site: Secondary | ICD-10-CM | POA: Insufficient documentation

## 2022-11-17 DIAGNOSIS — K219 Gastro-esophageal reflux disease without esophagitis: Secondary | ICD-10-CM | POA: Insufficient documentation

## 2022-11-17 DIAGNOSIS — Z8739 Personal history of other diseases of the musculoskeletal system and connective tissue: Secondary | ICD-10-CM | POA: Insufficient documentation

## 2022-11-17 DIAGNOSIS — R0602 Shortness of breath: Secondary | ICD-10-CM | POA: Insufficient documentation

## 2022-11-17 DIAGNOSIS — G8929 Other chronic pain: Secondary | ICD-10-CM | POA: Insufficient documentation

## 2022-11-17 DIAGNOSIS — Z973 Presence of spectacles and contact lenses: Secondary | ICD-10-CM | POA: Insufficient documentation

## 2022-11-17 DIAGNOSIS — Z86718 Personal history of other venous thrombosis and embolism: Secondary | ICD-10-CM | POA: Insufficient documentation

## 2022-11-17 DIAGNOSIS — L309 Dermatitis, unspecified: Secondary | ICD-10-CM | POA: Insufficient documentation

## 2022-11-28 ENCOUNTER — Encounter: Payer: Self-pay | Admitting: Cardiology

## 2022-11-28 ENCOUNTER — Ambulatory Visit: Payer: BLUE CROSS/BLUE SHIELD | Attending: Cardiology | Admitting: Cardiology

## 2022-11-28 VITALS — BP 136/80 | HR 64 | Ht 70.0 in | Wt 186.1 lb

## 2022-11-28 DIAGNOSIS — E782 Mixed hyperlipidemia: Secondary | ICD-10-CM | POA: Diagnosis not present

## 2022-11-28 DIAGNOSIS — Z86711 Personal history of pulmonary embolism: Secondary | ICD-10-CM

## 2022-11-28 DIAGNOSIS — R0789 Other chest pain: Secondary | ICD-10-CM

## 2022-11-28 NOTE — Progress Notes (Signed)
Cardiology Office Note:    Date:  11/28/2022   ID:  Jonathan Archer, DOB Jul 08, 1971, MRN [id number]  PCP:  Ronnald Nian, MD  Cardiologist:  Garwin Brothers, MD   Referring MD: Ronnald Nian, MD    ASSESSMENT:    1. Mixed dyslipidemia   2. History of pulmonary embolism   3. Atypical chest pain    PLAN:    In order of problems listed above:  Primary prevention stressed with the patient.  Importance of compliance with diet medication stressed and patient verbalized standing. History of pulmonary embolism: On anticoagulation.  Benefits risks explained this is managed by primary care. History of mixed dyslipidemia: Diet was emphasized.  He is doing well with this. Patient will be seen in follow-up appointment in 6 months or earlier if the patient has any concerns.    Medication Adjustments/Labs and Tests Ordered: Current medicines are reviewed at length with the patient today.  Concerns regarding medicines are outlined above.  No orders of the defined types were placed in this encounter.  No orders of the defined types were placed in this encounter.    No chief complaint on file.    History of Present Illness:    Jonathan Archer is a 52 y.o. male.  Patient has past medical history of pulmonary embolism and currently on anticoagulation, mixed dyslipidemia.  He denies any problems at this time and takes care of activities of daily living.  No chest pain orthopnea or PND.  At the time of my evaluation, the patient is alert awake oriented and in no distress.  Surgery for micro fracture of his knee and has recovered well.  He tells me that he is playing golf without any issues now.  Past Medical History:  Diagnosis Date   Allergic rhinitis due to pollen 01/12/2015   Arthritis of hip 09/14/2011   Atypical chest pain 06/24/2018   cardiologist-- dr Tomie China;  work-up done , normal nuclear stress test 11-14-2021, normal echo 12-02-2019, and normal coronary CT with calcium score zero  w/ no evidence cad cone 08-03-2019   Chronic pain of left knee    Current long-term use of anticoagulant medication with history of deep venous thrombosis (DVT)    (takes eliquis, managed by cardiology)  hx negative work-up for clotting disorder by dr Mosetta Putt 05/ 2020//   recurrent first time LLE DVT 01/31/ 2007 provoked post op left arthroscopy w/ PE;  second time 10/ 04/ 2019 10 months post op left knee ACL/ MCL repairs LLE 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  (seen by vascular dr Jasmine Awe 12-18-2018)   Current use of long term anticoagulation 09/22/2021   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   Eczema    ED (erectile dysfunction)    Family history of heart disease in male family member before age 61 04/12/2018   Fracture of distal phalanx of finger 09/20/2022   GERD (gastroesophageal reflux disease)    History of Osgood-Schlatter disease    right lower extremity,  s/p bone removal in 1991   History of pulmonary embolism 06/24/2018   Hypercoagulability due to atrial fibrillation (HCC) 05/08/2018   Hypogonadism male    Incomplete RBBB    Mallet finger of left hand 06/28/2021   Mixed dyslipidemia 04/24/2018   OA (osteoarthritis)    left hip and right thumb   Pain in left knee 10/17/2017   Paresthesia 07/15/2018   SOB (shortness of breath) on exertion    per  pt w/ stairs when stop recovers quickly  (ok w/ yard and house hold work)   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   Wears glasses     Past Surgical History:  Procedure Laterality Date   ARTHROSCOPIC REPAIR ACL Left 06/2017   and MCL repair   KNEE ARTHROSCOPY Left    1996;  1998;  01/ 2007   KNEE ARTHROSCOPY WITH DRILLING/MICROFRACTURE Left 05/09/2022   Procedure: KNEE ARTHROSCOPY WITH CHONDROPLASTY , MICROFRACTURE;  Surgeon: Eugenia Mcalpine, MD;  Location: [facility];  Service: Orthopedics;  Laterality: Left;   KNEE SURGERY Right 1991   removal of bone (shin)    Current  Medications: Current Meds  Medication Sig   acetaminophen (TYLENOL) 500 MG tablet Take 1,000 mg by mouth every 6 (six) hours as needed for mild pain or moderate pain.   apixaban (ELIQUIS) 5 MG TABS tablet Take 1 tablet (5 mg total) by mouth 2 (two) times daily.   cetirizine (ZYRTEC) 10 MG tablet Take 10 mg by mouth daily.   Multiple Vitamin (MULTIVITAMIN) tablet Take 1 tablet by mouth daily.   nitroGLYCERIN (NITROSTAT) 0.4 MG SL tablet Place 0.4 mg under the tongue every 5 (five) minutes as needed for chest pain.     Allergies:   Hydrocodone   Social History   Socioeconomic History   Marital status: Single    Spouse name: Not on file   Number of children: 3   Years of education: college   Highest education level: Not on file  Occupational History   Occupation: sales  Tobacco Use   Smoking status: Never   Smokeless tobacco: Never  Vaping Use   Vaping Use: Never used  Substance and Sexual Activity   Alcohol use: Not Currently    Comment: rarely   Drug use: Yes    Types: Marijuana    Comment: 05/07/2022   Sexual activity: Not on file  Other Topics Concern   Not on file  Social History Narrative   Lives at home with his son.   Left-sided.   1-2 cups of Pepsi per day.   Social Determinants of Health   Financial Resource Strain: Not on file  Food Insecurity: Not on file  Transportation Needs: Not on file  Physical Activity: Not on file  Stress: Not on file  Social Connections: Not on file     Family History: The patient's family history includes Clotting disorder in his father, maternal grandmother, and paternal grandmother; Heart attack in his father, mother, and sister; Heart disease in his father, mother, and sister.  ROS:   Please see the history of present illness.    All other systems reviewed and are negative.  EKGs/Labs/Other Studies Reviewed:    The following studies were reviewed today: I discussed my findings with the patient at length   Recent  Labs: 05/09/2022: ALT 19; BUN 11; Creatinine, Ser 1.33; Hemoglobin 14.6; Platelets 234; Potassium 4.0; Sodium 139  Recent Lipid Panel    Component Value Date/Time   CHOL 182 02/07/2022 1527   TRIG 133 02/07/2022 1527   HDL 39 (L) 02/07/2022 1527   CHOLHDL 4.7 02/07/2022 1527   CHOLHDL 3.6 01/12/2015 0001   VLDL 13 01/12/2015 0001   LDLCALC 119 (H) 02/07/2022 1527    Physical Exam:    VS:  BP 136/80   Pulse 64   Ht 5\' 10"  (1.778 m)   Wt 186 lb 1.3 oz (84.4 kg)   SpO2 98%   BMI 26.70 kg/m  Wt Readings from Last 3 Encounters:  11/28/22 186 lb 1.3 oz (84.4 kg)  05/09/22 183 lb 14.4 oz (83.4 kg)  03/09/22 178 lb (80.7 kg)     GEN: Patient is in no acute distress HEENT: Normal NECK: No JVD; No carotid bruits LYMPHATICS: No lymphadenopathy CARDIAC: Hear sounds regular, 2/6 systolic murmur at the apex. RESPIRATORY:  Clear to auscultation without rales, wheezing or rhonchi  ABDOMEN: Soft, non-tender, non-distended MUSCULOSKELETAL:  No edema; No deformity  SKIN: Warm and dry NEUROLOGIC:  Alert and oriented x 3 PSYCHIATRIC:  Normal affect   Signed, Garwin Brothers, MD  11/28/2022 3:36 PM    [facility]

## 2022-11-28 NOTE — Patient Instructions (Signed)
Medication Instructions:  Your physician recommends that you continue on your current medications as directed. Please refer to the Current Medication list given to you today.  *If you need a refill on your cardiac medications before your next appointment, please call your pharmacy*   Lab Work: None ordered If you have labs (blood work) drawn today and your tests are completely normal, you will receive your results only by: MyChart Message (if you have MyChart) OR A paper copy in the mail If you have any lab test that is abnormal or we need to change your treatment, we will call you to review the results.   Testing/Procedures: None ordered   Follow-Up: At [facility], you and your health needs are our priority.  As part of our continuing mission to provide you with exceptional heart care, we have created designated Provider Care Teams.  These Care Teams include your primary Cardiologist (physician) and Advanced Practice Providers (APPs -  Physician Assistants and Nurse Practitioners) who all work together to provide you with the care you need, when you need it.  We recommend signing up for the patient portal called "MyChart".  Sign up information is provided on this After Visit Summary.  MyChart is used to connect with patients for Virtual Visits (Telemedicine).  Patients are able to view lab/test results, encounter notes, upcoming appointments, etc.  Non-urgent messages can be sent to your provider as well.   To learn more about what you can do with MyChart, go to [email or web address].    Your next appointment:   9 month(s)  The format for your next appointment:   In Person  Provider:   Rajan Revankar, MD    Other Instructions none  Important Information About Sugar       

## 2023-01-04 ENCOUNTER — Ambulatory Visit: Payer: PRIVATE HEALTH INSURANCE | Admitting: Medical

## 2023-01-09 ENCOUNTER — Other Ambulatory Visit: Payer: Self-pay

## 2023-01-09 ENCOUNTER — Emergency Department (HOSPITAL_BASED_OUTPATIENT_CLINIC_OR_DEPARTMENT_OTHER)
Admission: EM | Admit: 2023-01-09 | Discharge: 2023-01-09 | Disposition: A | Discharge status: Home / Self Care | Payer: BLUE CROSS/BLUE SHIELD | Attending: Emergency Medicine | Admitting: Emergency Medicine

## 2023-01-09 ENCOUNTER — Emergency Department (HOSPITAL_BASED_OUTPATIENT_CLINIC_OR_DEPARTMENT_OTHER): Payer: BLUE CROSS/BLUE SHIELD

## 2023-01-09 ENCOUNTER — Encounter (HOSPITAL_BASED_OUTPATIENT_CLINIC_OR_DEPARTMENT_OTHER): Payer: Self-pay | Admitting: Emergency Medicine

## 2023-01-09 DIAGNOSIS — R519 Headache, unspecified: Secondary | ICD-10-CM | POA: Diagnosis not present

## 2023-01-09 DIAGNOSIS — Z1152 Encounter for screening for COVID-19: Secondary | ICD-10-CM | POA: Insufficient documentation

## 2023-01-09 DIAGNOSIS — R5381 Other malaise: Secondary | ICD-10-CM | POA: Insufficient documentation

## 2023-01-09 DIAGNOSIS — R051 Acute cough: Secondary | ICD-10-CM | POA: Insufficient documentation

## 2023-01-09 DIAGNOSIS — L299 Pruritus, unspecified: Secondary | ICD-10-CM | POA: Insufficient documentation

## 2023-01-09 DIAGNOSIS — R0602 Shortness of breath: Secondary | ICD-10-CM | POA: Diagnosis not present

## 2023-01-09 DIAGNOSIS — R5383 Other fatigue: Secondary | ICD-10-CM | POA: Diagnosis not present

## 2023-01-09 DIAGNOSIS — R531 Weakness: Secondary | ICD-10-CM | POA: Insufficient documentation

## 2023-01-09 DIAGNOSIS — Z7901 Long term (current) use of anticoagulants: Secondary | ICD-10-CM | POA: Diagnosis not present

## 2023-01-09 LAB — URINALYSIS, MICROSCOPIC (REFLEX)
Squamous Epithelial / HPF: NONE SEEN /HPF (ref 0–5)
WBC, UA: NONE SEEN WBC/hpf (ref 0–5)

## 2023-01-09 LAB — URINALYSIS, ROUTINE W REFLEX MICROSCOPIC
Bilirubin Urine: NEGATIVE
Glucose, UA: NEGATIVE mg/dL
Ketones, ur: NEGATIVE mg/dL
Leukocytes,Ua: NEGATIVE
Nitrite: NEGATIVE
Protein, ur: NEGATIVE mg/dL
Specific Gravity, Urine: 1.025 (ref 1.005–1.030)
pH: 6 (ref 5.0–8.0)

## 2023-01-09 LAB — COMPREHENSIVE METABOLIC PANEL
ALT: 25 U/L (ref 0–44)
AST: 24 U/L (ref 15–41)
Albumin: 3.9 g/dL (ref 3.5–5.0)
Alkaline Phosphatase: 85 U/L (ref 38–126)
Anion gap: 8 (ref 5–15)
BUN: 13 mg/dL (ref 6–20)
CO2: 26 mmol/L (ref 22–32)
Calcium: 8.9 mg/dL (ref 8.9–10.3)
Chloride: 101 mmol/L (ref 98–111)
Creatinine, Ser: 1.24 mg/dL (ref 0.61–1.24)
GFR, Estimated: >60 mL/min (ref >60)
Glucose, Bld: 115 mg/dL — ABNORMAL HIGH (ref 70–99)
Potassium: 4.5 mmol/L (ref 3.5–5.1)
Sodium: 135 mmol/L (ref 135–145)
Total Bilirubin: 0.4 mg/dL (ref 0.3–1.2)
Total Protein: 7.6 g/dL (ref 6.5–8.1)

## 2023-01-09 LAB — CBC WITH DIFFERENTIAL/PLATELET
Abs Immature Granulocytes: 0.03 10*3/uL (ref 0.00–0.07)
Basophils Absolute: 0 10*3/uL (ref 0.0–0.1)
Basophils Relative: 0 %
Eosinophils Absolute: 0.1 10*3/uL (ref 0.0–0.5)
Eosinophils Relative: 1 %
HCT: 42.8 % (ref 39.0–52.0)
Hemoglobin: 14.4 g/dL (ref 13.0–17.0)
Immature Granulocytes: 0 %
Lymphocytes Relative: 17 %
Lymphs Abs: 1.5 10*3/uL (ref 0.7–4.0)
MCH: 29.3 pg (ref 26.0–34.0)
MCHC: 33.6 g/dL (ref 30.0–36.0)
MCV: 87 fL (ref 80.0–100.0)
Monocytes Absolute: 0.5 10*3/uL (ref 0.1–1.0)
Monocytes Relative: 6 %
Neutro Abs: 6.7 10*3/uL (ref 1.7–7.7)
Neutrophils Relative %: 76 %
Platelets: 226 10*3/uL (ref 150–400)
RBC: 4.92 MIL/uL (ref 4.22–5.81)
RDW: 12.8 % (ref 11.5–15.5)
WBC: 8.8 10*3/uL (ref 4.0–10.5)
nRBC: 0 % (ref 0.0–0.2)

## 2023-01-09 LAB — RESP PANEL BY RT-PCR (RSV, FLU A&B, COVID)  RVPGX2
Influenza A by PCR: NEGATIVE
Influenza B by PCR: NEGATIVE
Resp Syncytial Virus by PCR: NEGATIVE
SARS Coronavirus 2 by RT PCR: NEGATIVE

## 2023-01-09 LAB — LIPASE, BLOOD: Lipase: 36 U/L (ref 11–51)

## 2023-01-09 MED ORDER — KETOROLAC TROMETHAMINE 30 MG/ML IJ SOLN
30.0000 mg | Freq: Once | INTRAMUSCULAR | Status: AC
  Administered 2023-01-09: 30 mg via INTRAVENOUS
  Filled 2023-01-09: qty 1

## 2023-01-09 MED ORDER — SODIUM CHLORIDE 0.9 % IV BOLUS
1000.0000 mL | Freq: Once | INTRAVENOUS | Status: AC
  Administered 2023-01-09: 1000 mL via INTRAVENOUS

## 2023-01-09 NOTE — Discharge Instructions (Addendum)
1.  At this time I suspect you have a viral illness.  Hydrate well, rest and take exercise Tylenol for headache, body ache or pain.  Your symptoms may persist for another week or so.  If you develop worsening or new symptoms, symptoms are persisting beyond another week you may need additional evaluation.  Schedule follow-up appointment with your doctor for about a week from now, return to the emergency department immediately if new concerning symptoms develop. 2.  You have a trace of blood in your urine.  This is probably due to taking Eliquis.  Follow-up with your doctor to continue monitoring this.

## 2023-01-09 NOTE — ED Provider Notes (Signed)
[facility] Provider Note   CSN: [id number] Arrival date & time: 01/09/23  4098     History  Chief Complaint  Patient presents with   Weakness    Jonathan Archer is a 52 y.o. male.  HPI Patient reports has been sick for about 6 days.  Symptoms have included generalized headache, some sensation of scratchy throat and mild left ear pain that is coming gone.  Patient reports he is also had a cough and sometimes feels slightly short of breath.  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  He is chronically anticoagulated on Eliquis.  He reports sometimes he can feel like there is a sharp pain in his chest and suspects that the small PE has passed.  Patient is also had some loose stool over about the past 6 days.  Patient felt the symptoms coincided with getting several wasp stings on the right wrist about 6 days ago.  However all of that the swelling has nearly resolved.  Patient has still some itching in the area but no longer swelling or pain.  Around the same time he had gone to a graduation party and encounter multiple people.  He reports he is very concerned because symptoms have been lingering for at least 6 days and he still does not feel well.    Home Medications Prior to Admission medications   Medication Sig Start Date End Date Taking? Authorizing Provider  acetaminophen (TYLENOL) 500 MG tablet Take 1,000 mg by mouth every 6 (six) hours as needed for mild pain or moderate pain.    [provider]  apixaban (ELIQUIS) 5 MG TABS tablet Take 1 tablet (5 mg total) by mouth 2 (two) times daily. 09/12/22   Revankar, Aundra Dubin, MD  cetirizine (ZYRTEC) 10 MG tablet Take 10 mg by mouth daily.    [provider]  Multiple Vitamin (MULTIVITAMIN) tablet Take 1 tablet by mouth daily.    [provider]  nitroGLYCERIN (NITROSTAT) 0.4 MG SL tablet Place 0.4 mg under the tongue every 5 (five) minutes as needed for chest pain.     [provider]      Allergies    Hydrocodone    Review of Systems   Review of Systems  Physical Exam Updated Vital Signs BP (!) 131/92   Pulse (!) 47   Temp 98.4 F (36.9 C)   Resp 18   Ht 5\' 10"  (1.778 m)   Wt 83.9 kg   SpO2 100%   BMI 26.54 kg/m  Physical Exam Constitutional:      General: He is not in acute distress.    Appearance: Normal appearance. He is not ill-appearing.  HENT:     Head: Normocephalic and atraumatic.     Right Ear: Tympanic membrane normal.     Ears:     Comments: Left TM just mildly retracted.  No bulging or significant erythema.    Mouth/Throat:     Mouth: Mucous membranes are moist.     Pharynx: Oropharynx is clear. No oropharyngeal exudate.  Eyes:     Extraocular Movements: Extraocular movements intact.     Conjunctiva/sclera: Conjunctivae normal.     Pupils: Pupils are equal, round, and reactive to light.  Cardiovascular:     Rate and Rhythm: Normal rate and regular rhythm.  Pulmonary:     Effort: Pulmonary effort is normal.     Breath sounds: Normal breath sounds.  Abdominal:     General: There  is no distension.     Palpations: Abdomen is soft.     Tenderness: There is no abdominal tenderness. There is no guarding.  Musculoskeletal:        General: No swelling or tenderness. Normal range of motion.     Cervical back: Neck supple.     Right lower leg: No edema.     Left lower leg: No edema.  Skin:    General: Skin is warm and dry.     Findings: No rash.     Comments: Near the right wrist where patient previously had a sting, very mildly perceptible erythema.  No swelling and no joint effusion.  Neurological:     General: No focal deficit present.     Mental Status: He is alert and oriented to person, place, and time.     Motor: No weakness.     Coordination: Coordination normal.  Psychiatric:        Mood and Affect: Mood normal.     ED Results / Procedures / Treatments   Labs (all labs ordered are listed, but  only abnormal results are displayed) Labs Reviewed  COMPREHENSIVE METABOLIC PANEL - Abnormal; Notable for the following components:      Result Value   Glucose, Bld 115 (*)    All other components within normal limits  URINALYSIS, ROUTINE W REFLEX MICROSCOPIC - Abnormal; Notable for the following components:   Hgb urine dipstick MODERATE (*)    All other components within normal limits  URINALYSIS, MICROSCOPIC (REFLEX) - Abnormal; Notable for the following components:   Bacteria, UA RARE (*)    All other components within normal limits  RESP PANEL BY RT-PCR (RSV, FLU A&B, COVID)  RVPGX2  LIPASE, BLOOD  CBC WITH DIFFERENTIAL/PLATELET    EKG None  Radiology DG Chest 2 View  Result Date: 01/09/2023 CLINICAL DATA:  Cough, fever. EXAM: CHEST - 2 VIEW COMPARISON:  March 01, 2022. FINDINGS: The heart size and mediastinal contours are within normal limits. Both lungs are clear. The visualized skeletal structures are unremarkable. IMPRESSION: No active cardiopulmonary disease. Electronically Signed   By: Lupita Raider M.D.   On: 01/09/2023 09:43    Procedures Procedures    Medications Ordered in ED Medications  sodium chloride 0.9 % bolus 1,000 mL (1,000 mLs Intravenous New Bag/Given 01/09/23 0809)  ketorolac (TORADOL) 30 MG/ML injection 30 mg (30 mg Intravenous Given 01/09/23 0809)    ED Course/ Medical Decision Making/ A&P                             Medical Decision Making Amount and/or Complexity of Data Reviewed Labs: ordered. Radiology: ordered.  Risk Prescription drug management.   Patient presents with multiple generalized symptoms.  Predominant symptoms seem to be generalized headache, malaise, cough, and loose stool.  Clinically patient is well in appearance.  Medical history is for PE and DVT, very distant and dictated by knee surgeries.  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  He does describe having had complete workup to rule  out other hematological cause for prior DVT.  Symptoms have been persistent, will obtain basic lab work and also COVID and influenza testing.  Patient describes cough.  Lungs are clear to auscultation.  Oxygen saturation 100%.  Will obtain chest x-ray but at this time have low suspicion for PE aside from patient's prior history.  No lower extremity swelling or edema.   Diagnostic  workup normal except trace hematuria.  At this time I suspect viral syndrome.  Patient has mild persistent symptoms for 2 weeks duration.  He is clinically well in appearance.  Labs are normal.  This time will recommend extra strength Tylenol and rest with follow-up in approximately a week.  Instructions are to return immediately if new concerning symptoms develop.        Final Clinical Impression(s) / ED Diagnoses Final diagnoses:  Acute cough  Acute nonintractable headache, unspecified headache type  Malaise and fatigue    Rx / DC Orders ED Discharge Orders     None         Arby Barrette, MD 01/09/23 1012

## 2023-01-09 NOTE — ED Triage Notes (Signed)
Pt reports fever, chills, cough, headache for the past week. States he wasn't feeling well and stung by multiple hornets last Sunday and symptoms developed a few days after.

## 2023-01-22 ENCOUNTER — Ambulatory Visit (INDEPENDENT_AMBULATORY_CARE_PROVIDER_SITE_OTHER): Payer: 59 | Admitting: Medical

## 2023-01-22 VITALS — BP 120/70 | HR 57 | Temp 97.6°F | Wt 188.4 lb

## 2023-01-22 DIAGNOSIS — Z79899 Other long term (current) drug therapy: Secondary | ICD-10-CM | POA: Diagnosis not present

## 2023-01-22 DIAGNOSIS — H6690 Otitis media, unspecified, unspecified ear: Secondary | ICD-10-CM | POA: Diagnosis not present

## 2023-01-22 MED ORDER — AZITHROMYCIN 250 MG PO TABS: ORAL_TABLET | ORAL | 0 refills | Status: DC

## 2023-01-22 NOTE — Progress Notes (Signed)
Subjective:  Jonathan Archer is a 52 y.o. male who presents for Chief Complaint  Patient presents with   ear pain    Ear pain since 6/10 and still having issues. Went in on 6/19 to urgent care and got anitbiotics and ear drops and still feel pressure.      Here for recheck on ear infection.  He was seen by urgent care 01/10/2023 for the same.  At that time he was having left ear pain, stopped up feeling and drainage.  He was put on Augmentin and an eardrop.  Still feels like he cannot hear out of it.  No other major symptoms.  No fever, antibiotics or chills, no cough, no sore throat.  He does take his regular allergy pill every day Zyrtec.  No sick contacts.  No other aggravating or relieving factors.    No other c/o.  Past Medical History:  Diagnosis Date   Allergic rhinitis due to pollen 01/12/2015   Arthritis of hip 09/14/2011   Atypical chest pain 06/24/2018   cardiologist-- dr Tomie China;  work-up done , normal nuclear stress test 11-14-2021, normal echo 12-02-2019, and normal coronary CT with calcium score zero w/ no evidence cad cone 08-03-2019   Chronic pain of left knee    Current long-term use of anticoagulant medication with history of deep venous thrombosis (DVT)    (takes eliquis, managed by cardiology)  hx negative work-up for clotting disorder by dr Mosetta Putt 05/ 2020//   recurrent first time LLE DVT 01/31/ 2007 provoked post op left arthroscopy w/ PE;  second time 10/ 04/ 2019 10 months post op left knee ACL/ MCL repairs LLE 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  (seen by vascular dr Jasmine Awe 12-18-2018)   Current use of long term anticoagulation 09/22/2021   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   Eczema    ED (erectile dysfunction)    Family history of heart disease in male family member before age 52 04/12/2018   Fracture of distal phalanx of finger 09/20/2022   GERD (gastroesophageal reflux disease)    History of Osgood-Schlatter disease    right lower  extremity,  s/p bone removal in 1991   History of pulmonary embolism 06/24/2018   Hypercoagulability due to atrial fibrillation (HCC) 05/08/2018   Hypogonadism male    Incomplete RBBB    Mallet finger of left hand 06/28/2021   Mixed dyslipidemia 04/24/2018   OA (osteoarthritis)    left hip and right thumb   Pain in left knee 10/17/2017   Paresthesia 07/15/2018   SOB (shortness of breath) on exertion    per pt w/ stairs when stop recovers quickly  (ok w/ yard and house hold work)   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   Wears glasses    Current Outpatient Medications on File Prior to Visit  Medication Sig Dispense Refill   acetaminophen (TYLENOL) 500 MG tablet Take 1,000 mg by mouth every 6 (six) hours as needed for mild pain or moderate pain.     apixaban (ELIQUIS) 5 MG TABS tablet Take 1 tablet (5 mg total) by mouth 2 (two) times daily. 60 tablet 5   cetirizine (ZYRTEC) 10 MG tablet Take 10 mg by mouth daily.     Multiple Vitamin (MULTIVITAMIN) tablet Take 1 tablet by mouth daily.     nitroGLYCERIN (NITROSTAT) 0.4 MG SL tablet Place 0.4 mg under the tongue every 5 (five) minutes as needed for chest pain.     No current facility-administered  medications on file prior to visit.    The following portions of the patient's history were reviewed and updated as appropriate: allergies, current medications, past family history, past medical history, past social history, past surgical history and problem list.  ROS Otherwise as in subjective above  Objective: BP 120/70   Pulse (!) 57   Temp 97.6 F (36.4 C)   Wt 188 lb 6.4 oz (85.5 kg)   BMI 27.03 kg/m   General appearance: alert, no distress, well developed, well nourished HEENT: normocephalic, sclerae anicteric, conjunctiva pink and moist, right TM normal, left TM with retraction and erythema, no discharge.  Nares patent, no discharge or erythema, pharynx normal Oral cavity: MMM, no lesions Neck: supple, no lymphadenopathy,  no thyromegaly, no masses   Assessment: Encounter Diagnoses  Name Primary?   Subacute otitis media, unspecified otitis media type Yes   High risk medication use      Plan: Improving but not fully resolved.  Begin Zpak below.  He already completed a course of Augmentin and Ciprodex drops. In addition to Z-Pak antibiotic, begin Sudafed over-the-counter for the next several days along with either Flonase or Afrin for 3 days or less if you decide to use Afrin.  Increase water intake and encourage swallowing throughout the day.  If not seeing some improvement within the next 3 days then let me know.  Cindy was seen today for ear pain.  Diagnoses and all orders for this visit:  Subacute otitis media, unspecified otitis media type  High risk medication use  Other orders -     azithromycin (ZITHROMAX) 250 MG tablet; 2 tablets day 1, then 1 tablet days 2-4    Follow up: prn

## 2023-01-24 ENCOUNTER — Telehealth: Payer: Self-pay | Admitting: Internal Medicine

## 2023-01-24 NOTE — Telephone Encounter (Signed)
Pt is doing everything he is suppose to be doing. His nose mucous has been clear up until today it was green, not sure if that makes a difference of not. He said if he is not feeling better by Friday then he will call back in friday

## 2023-01-24 NOTE — Telephone Encounter (Signed)
Pt called and states he is not any better from when he was seen 2 days ago. Last night he started coughing and started with runny nose. Pt is still having ear pain and pressure. Pt can not come in today for a visit.

## 2023-01-26 ENCOUNTER — Ambulatory Visit (INDEPENDENT_AMBULATORY_CARE_PROVIDER_SITE_OTHER): Payer: 59 | Admitting: Nurse Practitioner

## 2023-01-26 ENCOUNTER — Encounter: Payer: Self-pay | Admitting: Nurse Practitioner

## 2023-01-26 VITALS — BP 130/80 | HR 66 | Temp 97.8°F | Ht 70.0 in | Wt 179.8 lb

## 2023-01-26 DIAGNOSIS — H66013 Acute suppurative otitis media with spontaneous rupture of ear drum, bilateral: Secondary | ICD-10-CM

## 2023-01-26 MED ORDER — LEVOFLOXACIN 500 MG PO TABS
500.00 mg | ORAL_TABLET | Freq: Every day | ORAL | 0 refills | Status: AC
Start: 2023-01-26 — End: 2023-02-02

## 2023-01-26 MED ORDER — VORICONAZOLE 200 MG PO TABS: 200.0000 mg | ORAL_TABLET | Freq: Two times a day (BID) | ORAL | 0 refills | Status: DC

## 2023-01-26 NOTE — Patient Instructions (Addendum)
I have sent in a medication called Voriconazole, which is an antifungal. I have also sent in one more antibiotic to try.   If you are not feeling better by Monday, please call. You should be feeling a little better, but not completely better.

## 2023-01-26 NOTE — Progress Notes (Signed)
  Tollie Eth, DNP, AGNP-c [facility] [address] [place], [place] [address] [phone]   ACUTE VISIT- ESTABLISHED PATIENT  Blood pressure 130/80, pulse 66, temperature 97.8 F (36.6 C), temperature source Tympanic, height 5\' 10"  (1.778 m), weight 179 lb 12.8 oz (81.6 kg), SpO2 97%.  Subjective:  HPI Jonathan Archer is a 52 y.o. male presents to day for evaluation of acute concerns.   Jonathan Archer reports ongoing issues with ear pain and pressure that have been present since June of this year.  He reports utilization of antibiotics and eardrops previously with no successful resolution of his symptoms.  He tells me initially the pain was present in the left ear however this has improved to a mild discomfort.  At this time he is experiencing increased pain and pressure in the right ear, which is new.  He endorses green nasal discharge worse in the mornings.  He denies fever, chills, or sinus pain and pressure.     PMH, Medications, and Allergies reviewed and updated in chart as appropriate.   ROS negative except for what is listed in HPI. Objective:  Physical Exam Vitals and nursing note reviewed.  HENT:     Right Ear: Tenderness present. A middle ear effusion is present. Tympanic membrane is bulging.     Left Ear: Tenderness present. A middle ear effusion is present. Tympanic membrane is retracted.     Nose: Mucosal edema present.     Mouth/Throat:     Pharynx: Posterior oropharyngeal erythema and postnasal drip present.  Eyes:     Conjunctiva/sclera: Conjunctivae normal.  Cardiovascular:     Rate and Rhythm: Normal rate and regular rhythm.     Pulses: Normal pulses.     Heart sounds: Normal heart sounds.  Pulmonary:     Effort: Pulmonary effort is normal.     Breath sounds: Normal breath sounds.  Skin:    General: Skin is warm and dry.     Capillary Refill: Capillary refill takes less than 2 seconds.  Neurological:     Mental Status: He is alert and oriented  to person, place, and time.  Psychiatric:        Behavior: Behavior normal.         Assessment & Plan:   Problem List Items Addressed This Visit     Non-recurrent acute suppurative otitis media of both ears with spontaneous rupture of tympanic membranes - Primary    Recurrent infection of the left ear is present with new onset of infection in the right ear evaluated today.  Given the lack of resolution with oral antibiotics fungal infection is possible. Plan: - voriconazole and levofloxacin prescribed for infection to cover both bacterial and fungal.  - Follow-up if no improvement in symptoms by Monday        Tollie Eth, DNP, AGNP-c   History, Medications, Surgery, SDOH, and Family History reviewed and updated as appropriate.

## 2023-01-29 ENCOUNTER — Ambulatory Visit: Payer: 59 | Admitting: Medical

## 2023-01-29 ENCOUNTER — Telehealth: Payer: Self-pay | Admitting: Internal Medicine

## 2023-01-29 DIAGNOSIS — H66013 Acute suppurative otitis media with spontaneous rupture of ear drum, bilateral: Secondary | ICD-10-CM

## 2023-01-29 MED ORDER — VORICONAZOLE 200 MG PO TABS
200.0000 mg | ORAL_TABLET | Freq: Two times a day (BID) | ORAL | 0 refills | Status: DC
Start: 2023-01-29 — End: 2023-12-10

## 2023-01-29 NOTE — Telephone Encounter (Signed)
P.A has been approved for Vfend and I have sent it in to [facility] per pt's request

## 2023-01-29 NOTE — Telephone Encounter (Signed)
Pt is still not feeling well. Pt has PA for Vfend and he states levaquin is not working.

## 2023-02-06 ENCOUNTER — Encounter: Payer: Self-pay | Admitting: Medical

## 2023-02-06 ENCOUNTER — Telehealth: Payer: Self-pay | Admitting: Family Medicine

## 2023-02-06 ENCOUNTER — Ambulatory Visit: Payer: 59 | Admitting: Medical

## 2023-02-06 VITALS — BP 120/80 | HR 60 | Ht 70.0 in | Wt 189.0 lb

## 2023-02-06 DIAGNOSIS — H9192 Unspecified hearing loss, left ear: Secondary | ICD-10-CM

## 2023-02-06 DIAGNOSIS — H6593 Unspecified nonsuppurative otitis media, bilateral: Secondary | ICD-10-CM | POA: Diagnosis not present

## 2023-02-06 DIAGNOSIS — H938X2 Other specified disorders of left ear: Secondary | ICD-10-CM

## 2023-02-06 NOTE — Progress Notes (Signed)
Subjective:  Jonathan Archer is a 52 y.o. male who presents for Chief Complaint  Patient presents with   Ear Pain    Left ear pressure, but has lessened by 30-40%.      Here for follow-up on ear infection.  I saw him initially January 22, 2023.  At that time he had already been urgent care for ear infection on both sides.  He completed Augmentin oral antibiotic and Ciprodex drops.  He was not much improved so I put him on Z-Pak last visit which seem to do nothing.  He also did the other recommendations I gave him including trial of Sudafed and Flonase and some short-term Afrin.  He ended up coming back in the [place] area here on July 5 and tried a different antimicrobial.  He does see slight improvement but still only about 40% better overall  Hearing still is pressurized but improved.  Pain and overall feels improving but not back to normal like he thinks he should be.  No other aggravating or relieving factors.    No other c/o.  Past Medical History:  Diagnosis Date   Allergic rhinitis due to pollen 01/12/2015   Arthritis of hip 09/14/2011   Atypical chest pain 06/24/2018   cardiologist-- dr Tomie China;  work-up done , normal nuclear stress test 11-14-2021, normal echo 12-02-2019, and normal coronary CT with calcium score zero w/ no evidence cad cone 08-03-2019   Chronic pain of left knee    Current long-term use of anticoagulant medication with history of deep venous thrombosis (DVT)    (takes eliquis, managed by cardiology)  hx negative work-up for clotting disorder by dr Mosetta Putt 05/ 2020//   recurrent first time LLE DVT 01/31/ 2007 provoked post op left arthroscopy w/ PE;  second time 10/ 04/ 2019 10 months post op left knee ACL/ MCL repairs LLE 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  (seen by vascular dr Jasmine Awe 12-18-2018)   Current use of long term anticoagulation 09/22/2021   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   Eczema    ED (erectile dysfunction)    Family history of  heart disease in male family member before age 37 04/12/2018   Fracture of distal phalanx of finger 09/20/2022   GERD (gastroesophageal reflux disease)    History of Osgood-Schlatter disease    right lower extremity,  s/p bone removal in 1991   History of pulmonary embolism 06/24/2018   Hypercoagulability due to atrial fibrillation (HCC) 05/08/2018   Hypogonadism male    Incomplete RBBB    Mallet finger of left hand 06/28/2021   Mixed dyslipidemia 04/24/2018   OA (osteoarthritis)    left hip and right thumb   Pain in left knee 10/17/2017   Paresthesia 07/15/2018   SOB (shortness of breath) on exertion    per pt w/ stairs when stop recovers quickly  (ok w/ yard and house hold work)   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   Wears glasses    Current Outpatient Medications on File Prior to Visit  Medication Sig Dispense Refill   apixaban (ELIQUIS) 5 MG TABS tablet Take 1 tablet (5 mg total) by mouth 2 (two) times daily. 60 tablet 5   cetirizine (ZYRTEC) 10 MG tablet Take 10 mg by mouth daily.     Multiple Vitamin (MULTIVITAMIN) tablet Take 1 tablet by mouth daily.     omeprazole (PRILOSEC) 20 MG capsule Take 20 mg by mouth daily.     voriconazole (VFEND) 200 MG  tablet Take 1 tablet (200 mg total) by mouth 2 (two) times daily. 20 tablet 0   nitroGLYCERIN (NITROSTAT) 0.4 MG SL tablet Place 0.4 mg under the tongue every 5 (five) minutes as needed for chest pain. (Patient not taking: Reported on 01/26/2023)     No current facility-administered medications on file prior to visit.    The following portions of the patient's history were reviewed and updated as appropriate: allergies, current medications, past family history, past medical history, past social history, past surgical history and problem list.  ROS Otherwise as in subjective above  Objective: BP 120/80   Pulse 60   Ht 5\' 10"  (1.778 m)   Wt 189 lb (85.7 kg)   SpO2 97%   BMI 27.12 kg/m   General appearance: alert, no  distress, well developed, well nourished HEENT: normocephalic, sclerae anicteric, conjunctiva pink and moist, right TM with slight air-fluid level, left TM with air-fluid levels but no erythema.  Nares patent, no discharge or erythema, pharynx normal   Assessment: Encounter Diagnoses  Name Primary?   Bilateral serous otitis media, unspecified chronicity Yes   Ear pressure, left    Decreased hearing of left ear       Plan: Improving but not fully resolved.  He has already completed a round of Augmentin oral antibiotic, Z-Pak oral antibiotic, Ciprodex drops, decongestants including Sudafed and topical Afrin as well as Flonase.  Discussed case with supervising physician Dr. Susann Givens  Referral to ENT for further evaluation and treatment recommendations   [name] was seen today for ear pain.  Diagnoses and all orders for this visit:  Bilateral serous otitis media, unspecified chronicity -     Ambulatory referral to ENT  Ear pressure, left -     Ambulatory referral to ENT  Decreased hearing of left ear -     Ambulatory referral to ENT     Follow up: prn

## 2023-02-06 NOTE — Telephone Encounter (Signed)
Pt is coming in this afternoon for recheck

## 2023-02-06 NOTE — Telephone Encounter (Signed)
Jonathan Archer called and says he is getting better from his double ear infection but he isn't feeling 100% better yet and only has 2 pills left. He is thinking he needs another round but says if you want him to come in to be looked at again he doesn't mind.

## 2023-03-06 DIAGNOSIS — H66013 Acute suppurative otitis media with spontaneous rupture of ear drum, bilateral: Secondary | ICD-10-CM | POA: Insufficient documentation

## 2023-03-06 HISTORY — DX: Acute suppurative otitis media with spontaneous rupture of ear drum, bilateral: H66.013

## 2023-03-06 NOTE — Assessment & Plan Note (Signed)
Recurrent infection of the left ear is present with new onset of infection in the right ear evaluated today.  Given the lack of resolution with oral antibiotics fungal infection is possible. Plan: - voriconazole and levofloxacin prescribed for infection to cover both bacterial and fungal.  - Follow-up if no improvement in symptoms by Monday

## 2023-03-26 ENCOUNTER — Other Ambulatory Visit: Payer: Self-pay | Admitting: Cardiology

## 2023-03-26 DIAGNOSIS — I2782 Chronic pulmonary embolism: Secondary | ICD-10-CM

## 2023-03-27 NOTE — Telephone Encounter (Signed)
Prescription refill request for Eliquis received. Indication:PE Last office visit:5/24 Scr:1.24  6/24 Age: 52 Weight:85.7  KG  PRESCRIPTION REFILLED

## 2023-04-27 ENCOUNTER — Other Ambulatory Visit: Payer: Self-pay | Admitting: Family Medicine

## 2023-04-27 DIAGNOSIS — Z1212 Encounter for screening for malignant neoplasm of rectum: Secondary | ICD-10-CM

## 2023-04-27 DIAGNOSIS — Z1211 Encounter for screening for malignant neoplasm of colon: Secondary | ICD-10-CM

## 2023-05-03 DIAGNOSIS — Z1211 Encounter for screening for malignant neoplasm of colon: Secondary | ICD-10-CM | POA: Diagnosis not present

## 2023-05-03 DIAGNOSIS — Z1212 Encounter for screening for malignant neoplasm of rectum: Secondary | ICD-10-CM | POA: Diagnosis not present

## 2023-05-08 LAB — COLOGUARD: COLOGUARD: NEGATIVE

## 2023-09-18 ENCOUNTER — Encounter: Payer: Self-pay | Admitting: Internal Medicine

## 2023-10-06 ENCOUNTER — Other Ambulatory Visit: Payer: Self-pay | Admitting: Cardiology

## 2023-10-06 DIAGNOSIS — I2782 Chronic pulmonary embolism: Secondary | ICD-10-CM

## 2023-10-08 NOTE — Telephone Encounter (Signed)
 Prescription refill request for Eliquis received. Indication:pe Last office visit:5/24 Scr:1.24  6/24 Age: 53 Weight:85.7  kg  Prescription refilled

## 2023-10-12 ENCOUNTER — Other Ambulatory Visit: Payer: Self-pay

## 2023-10-18 ENCOUNTER — Encounter: Payer: Self-pay | Admitting: Cardiology

## 2023-10-18 ENCOUNTER — Ambulatory Visit: Payer: Self-pay | Attending: Cardiology | Admitting: Cardiology

## 2023-10-18 VITALS — BP 128/72 | HR 69 | Ht 70.0 in | Wt 190.0 lb

## 2023-10-18 DIAGNOSIS — E782 Mixed hyperlipidemia: Secondary | ICD-10-CM

## 2023-10-18 DIAGNOSIS — Z86711 Personal history of pulmonary embolism: Secondary | ICD-10-CM | POA: Diagnosis not present

## 2023-10-18 NOTE — Patient Instructions (Signed)

## 2023-10-18 NOTE — Progress Notes (Signed)
 Cardiology Office Note:    Date:  10/18/2023   ID:  Jonathan Archer, DOB [birth year], MRN [id number]  PCP:  Ronnald Nian, MD  Cardiologist:  Garwin Brothers, MD   Referring MD: Ronnald Nian, MD    ASSESSMENT:    1. Mixed dyslipidemia   2. History of pulmonary embolism    PLAN:    In order of problems listed above:  Primary prevention stressed with the patient.  Importance of compliance with diet medication stressed and patient verbalized standing. History of pulmonary embolism: Stable at this time on anticoagulation.  Followed by primary care. Mixed dyslipidemia: On lipid-lowering medications.  Followed by primary care.  Diet emphasized.  He was advised to stay active. Blood pressure is stable and he will be seen in follow-up appointment on annual basis.   Medication Adjustments/Labs and Tests Ordered: Current medicines are reviewed at length with the patient today.  Concerns regarding medicines are outlined above.  Orders Placed This Encounter  Procedures   EKG 12-Lead   No orders of the defined types were placed in this encounter.    No chief complaint on file.    History of Present Illness:    Jonathan Archer is a 53 y.o. male patient has past medical history of pulmonary embolism and dyslipidemia.  He denies any problems at this time and takes care of activities of daily living.  No chest pain orthopnea or PND.  At the time of my evaluation, the patient is alert awake oriented and in no distress.  He is here for routine follow-up.  Past Medical History:  Diagnosis Date   Allergic rhinitis due to pollen 01/12/2015   Arthritis of hip 09/14/2011   Atypical chest pain 06/24/2018   cardiologist-- dr Tomie China;  work-up done , normal nuclear stress test 11-14-2021, normal echo 12-02-2019, and normal coronary CT with calcium score zero w/ no evidence cad cone 08-03-2019   Chronic pain of left knee    Current long-term use of anticoagulant medication with history of deep  venous thrombosis (DVT)    (takes eliquis, managed by cardiology)  hx negative work-up for clotting disorder by dr Mosetta Putt 05/ 2020//   recurrent first time LLE DVT 01/31/ 2007 provoked post op left arthroscopy w/ PE;  second time 10/ 04/ 2019 10 months post op left knee ACL/ MCL repairs LLE 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  (seen by vascular dr Jasmine Awe 12-18-2018)   Current use of long term anticoagulation 09/22/2021   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   Eczema    ED (erectile dysfunction)    Family history of heart disease in male family member before age 46 04/12/2018   Fracture of distal phalanx of finger 09/20/2022   GERD (gastroesophageal reflux disease)    History of Osgood-Schlatter disease    right lower extremity,  s/p bone removal in 1991   History of pulmonary embolism 06/24/2018   Hypercoagulability due to atrial fibrillation (HCC) 05/08/2018   Hypogonadism male    Incomplete RBBB    Mallet finger of left hand 06/28/2021   Mixed dyslipidemia 04/24/2018   Non-recurrent acute suppurative otitis media of both ears with spontaneous rupture of tympanic membranes 03/06/2023   OA (osteoarthritis)    left hip and right thumb   Pain in left knee 10/17/2017   Paresthesia 07/15/2018   SOB (shortness of breath) on exertion    per pt w/ stairs when stop recovers quickly  (ok w/ yard  and house hold work)   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   Wears glasses     Past Surgical History:  Procedure Laterality Date   ARTHROSCOPIC REPAIR ACL Left 06/2017   and MCL repair   KNEE ARTHROSCOPY Left    1996;  1998;  01/ 2007   KNEE ARTHROSCOPY WITH DRILLING/MICROFRACTURE Left 05/09/2022   Procedure: KNEE ARTHROSCOPY WITH CHONDROPLASTY , MICROFRACTURE;  Surgeon: Eugenia Mcalpine, MD;  Location: [facility];  Service: Orthopedics;  Laterality: Left;   KNEE SURGERY Right 1991   removal of bone (shin)    Current Medications: Current Meds   Medication Sig   cetirizine (ZYRTEC) 10 MG tablet Take 10 mg by mouth daily.   ELIQUIS 5 MG TABS tablet TAKE 1 TABLET BY MOUTH TWICE A DAY   Multiple Vitamin (MULTIVITAMIN) tablet Take 1 tablet by mouth daily.   nitroGLYCERIN (NITROSTAT) 0.4 MG SL tablet Place 0.4 mg under the tongue every 5 (five) minutes as needed for chest pain.   omeprazole (PRILOSEC) 20 MG capsule Take 20 mg by mouth daily.   voriconazole (VFEND) 200 MG tablet Take 1 tablet (200 mg total) by mouth 2 (two) times daily.     Allergies:   Hydrocodone   Social History   Socioeconomic History   Marital status: Single    Spouse name: Not on file   Number of children: 3   Years of education: college   Highest education level: Not on file  Occupational History   Occupation: sales  Tobacco Use   Smoking status: Never   Smokeless tobacco: Never  Vaping Use   Vaping status: Never Used  Substance and Sexual Activity   Alcohol use: Not Currently    Comment: rarely   Drug use: Yes    Types: Marijuana    Comment: 05/07/2022   Sexual activity: Not on file  Other Topics Concern   Not on file  Social History Narrative   Lives at home with his son.   Left-sided.   1-2 cups of Pepsi per day.   Social Drivers of Corporate investment banker Strain: Not on file  Food Insecurity: Not on file  Transportation Needs: Not on file  Physical Activity: Not on file  Stress: Not on file  Social Connections: Not on file     Family History: The patient's family history includes Clotting disorder in his father, maternal grandmother, and paternal grandmother; Heart attack in his father, mother, and sister; Heart disease in his father, mother, and sister.  ROS:   Please see the history of present illness.    All other systems reviewed and are negative.  EKGs/Labs/Other Studies Reviewed:    The following studies were reviewed today: .Marland KitchenEKG Interpretation Date/Time:  Thursday October 18 2023 15:34:15 EDT Ventricular Rate:   69 PR Interval:  160 QRS Duration:  112 QT Interval:  392 QTC Calculation: 420 R Axis:   85  Text Interpretation: Normal sinus rhythm Right bundle branch block When compared with ECG of 01-Mar-2022 07:32, PREVIOUS ECG IS PRESENT Confirmed by Belva Crome [phone]) on 10/18/2023 3:50:48 PM     Recent Labs: 01/09/2023: ALT 25; BUN 13; Creatinine, Ser 1.24; Hemoglobin 14.4; Platelets 226; Potassium 4.5; Sodium 135  Recent Lipid Panel    Component Value Date/Time   CHOL 182 02/07/2022 1527   TRIG 133 02/07/2022 1527   HDL 39 (L) 02/07/2022 1527   CHOLHDL 4.7 02/07/2022 1527   CHOLHDL 3.6 01/12/2015 0001   VLDL 13 01/12/2015 0001  LDLCALC 119 (H) 02/07/2022 1527    Physical Exam:    VS:  BP 128/72   Pulse 69   Ht 5\' 10"  (1.778 m)   Wt 190 lb 0.6 oz (86.2 kg)   SpO2 98%   BMI 27.27 kg/m     Wt Readings from Last 3 Encounters:  10/18/23 190 lb 0.6 oz (86.2 kg)  02/06/23 189 lb (85.7 kg)  01/26/23 179 lb 12.8 oz (81.6 kg)     GEN: Patient is in no acute distress HEENT: Normal NECK: No JVD; No carotid bruits LYMPHATICS: No lymphadenopathy CARDIAC: Hear sounds regular, 2/6 systolic murmur at the apex. RESPIRATORY:  Clear to auscultation without rales, wheezing or rhonchi  ABDOMEN: Soft, non-tender, non-distended MUSCULOSKELETAL:  No edema; No deformity  SKIN: Warm and dry NEUROLOGIC:  Alert and oriented x 3 PSYCHIATRIC:  Normal affect   Signed, Garwin Brothers, MD  10/18/2023 3:51 PM    [facility]

## 2023-12-10 ENCOUNTER — Ambulatory Visit: Admitting: Family Medicine

## 2023-12-10 ENCOUNTER — Encounter: Payer: Self-pay | Admitting: Family Medicine

## 2023-12-10 VITALS — BP 124/80 | HR 49 | Ht 70.0 in | Wt 191.2 lb

## 2023-12-10 DIAGNOSIS — Z23 Encounter for immunization: Secondary | ICD-10-CM | POA: Diagnosis not present

## 2023-12-10 DIAGNOSIS — J301 Allergic rhinitis due to pollen: Secondary | ICD-10-CM

## 2023-12-10 DIAGNOSIS — B351 Tinea unguium: Secondary | ICD-10-CM

## 2023-12-10 DIAGNOSIS — I4819 Other persistent atrial fibrillation: Secondary | ICD-10-CM

## 2023-12-10 DIAGNOSIS — K219 Gastro-esophageal reflux disease without esophagitis: Secondary | ICD-10-CM

## 2023-12-10 DIAGNOSIS — E291 Testicular hypofunction: Secondary | ICD-10-CM

## 2023-12-10 DIAGNOSIS — E782 Mixed hyperlipidemia: Secondary | ICD-10-CM

## 2023-12-10 DIAGNOSIS — Z7901 Long term (current) use of anticoagulants: Secondary | ICD-10-CM

## 2023-12-10 DIAGNOSIS — D6869 Other thrombophilia: Secondary | ICD-10-CM | POA: Diagnosis not present

## 2023-12-10 DIAGNOSIS — Z0000 Encounter for general adult medical examination without abnormal findings: Secondary | ICD-10-CM

## 2023-12-10 DIAGNOSIS — L400 Psoriasis vulgaris: Secondary | ICD-10-CM

## 2023-12-10 DIAGNOSIS — I4510 Unspecified right bundle-branch block: Secondary | ICD-10-CM

## 2023-12-10 DIAGNOSIS — Z86711 Personal history of pulmonary embolism: Secondary | ICD-10-CM

## 2023-12-10 DIAGNOSIS — M150 Primary generalized (osteo)arthritis: Secondary | ICD-10-CM | POA: Diagnosis not present

## 2023-12-10 DIAGNOSIS — Z8249 Family history of ischemic heart disease and other diseases of the circulatory system: Secondary | ICD-10-CM

## 2023-12-10 LAB — LIPID PANEL

## 2023-12-10 MED ORDER — TERBINAFINE HCL 250 MG PO TABS: 250.0000 mg | ORAL_TABLET | Freq: Every day | ORAL | 1 refills | Status: AC

## 2023-12-10 NOTE — Progress Notes (Signed)
 Complete physical exam  Patient: Jonathan Archer   DOB: 11-22-1970   53 y.o. Male  MRN: [id number]  Subjective:     Chief Complaint  Patient presents with   Annual Exam    Cpe. Fasting. Getting red dry spots all over body.     Jonathan Archer is a 53 y.o. male who presents today for a complete physical exam.  He reports consuming a general diet. The patient does not participate in regular exercise at present. He generally feels well. He reports sleeping fairly well.  Continues on Eliquis  to treat underlying DVT.  He will be on this rest of his life.  He also has several reddish plaque-like lesions that he would like further evaluated and also complains of thickening of his toenails and to a lesser extent his hands.  He continues to smoke and is not interested in quitting.  His allergies seem to be under good control.  Presently he is not dating anyone he has had some arthritic issues in the past but this seems to be fairly stable at the present time.  Most recent fall risk assessment:    12/10/2023    8:17 AM  Fall Risk   Falls in the past year? 0  Number falls in past yr: 0  Injury with Fall? 0  Risk for fall due to : No Fall Risks  Follow up Falls evaluation completed     Most recent depression screenings:    12/10/2023    8:17 AM 02/07/2022    2:38 PM  PHQ 2/9 Scores  PHQ - 2 Score 2 1  PHQ- 9 Score 4     Vision:Not within last year  and Dental: No current dental problems and Last dental visit: less than 2 years    Immunization History  Administered Date(s) Administered   Influenza,inj,Quad PF,6+ Mos 04/12/2018, 09/22/2021   PFIZER Comirnaty(Gray Top)Covid-19 Tri-Sucrose Vaccine 11/04/2020   Tdap 09/20/2021    Health Maintenance  Topic Date Due   HIV Screening  Never done   Zoster Vaccines- Shingrix  (1 of 2) Never done   COVID-19 Vaccine (2 - Pfizer risk series) 11/25/2020   INFLUENZA VACCINE  02/22/2024   Fecal DNA (Cologuard)  05/02/2026   DTaP/Tdap/Td (2 - Td  or Tdap) 09/21/2031   Hepatitis C Screening  Completed   HPV VACCINES  Aged Out   Meningococcal B Vaccine  Aged Out    Patient Care Team: Watson Hacking, MD as PCP - General (Family Medicine) Dannis Dy, MD (Inactive) as Consulting Physician (Vascular Surgery)   Outpatient Medications Prior to Visit  Medication Sig Note   cetirizine (ZYRTEC) 10 MG tablet Take 10 mg by mouth daily.    ELIQUIS  5 MG TABS tablet TAKE 1 TABLET BY MOUTH TWICE A DAY    Multiple Vitamin (MULTIVITAMIN) tablet Take 1 tablet by mouth daily.    nitroGLYCERIN  (NITROSTAT ) 0.4 MG SL tablet Place 0.4 mg under the tongue every 5 (five) minutes as needed for chest pain. 12/10/2023: As needed   omeprazole (PRILOSEC) 20 MG capsule Take 20 mg by mouth daily.    voriconazole  (VFEND ) 200 MG tablet Take 1 tablet (200 mg total) by mouth 2 (two) times daily. (Patient not taking: Reported on 12/10/2023)    No facility-administered medications prior to visit.    Review of Systems  All other systems reviewed and are negative.   Family and social history as well as health maintenance and immunizations was reviewed.  Objective:      Physical Exam  Alert and in no distress. Tympanic membranes and canals are normal. Pharyngeal area is normal. Neck is supple without adenopathy or thyromegaly. Cardiac exam shows a regular sinus rhythm without murmurs or gallops. Lungs are clear to auscultation.      Assessment & Plan:    Discussed health benefits of physical activity, and encouraged him to engage in regular exercise appropriate for his age and condition.  Routine general medical examination at a health care facility  Primary osteoarthritis involving multiple joints  Incomplete RBBB  Hypogonadism male - Plan: Testosterone   Hypercoagulable state due to persistent atrial fibrillation (HCC) - Plan: CBC with Differential/Platelet, Comprehensive metabolic panel with GFR  History of pulmonary  embolism  Gastroesophageal reflux disease without esophagitis  Family history of heart disease in male family member before age 41 - Plan: CBC with Differential/Platelet, Comprehensive metabolic panel with GFR, Lipid panel  Mixed dyslipidemia - Plan: Lipid panel  Need for vaccination against Streptococcus pneumoniae - Plan: Pneumococcal conjugate vaccine 20-valent (Prevnar 20)  Allergic rhinitis due to pollen, unspecified seasonality  Need for shingles vaccine - Plan: Varicella-zoster vaccine IM  Current long-term use of anticoagulant medication with history of deep venous thrombosis (DVT)   Return in about 1 year (around 12/09/2024).      Ron Cobbs, MD

## 2023-12-11 ENCOUNTER — Ambulatory Visit: Payer: Self-pay | Admitting: Family Medicine

## 2023-12-11 LAB — CBC WITH DIFFERENTIAL/PLATELET
Basophils Absolute: 0 10*3/uL (ref 0.0–0.2)
Basos: 0 %
EOS (ABSOLUTE): 0 10*3/uL (ref 0.0–0.4)
Eos: 1 %
Hematocrit: 45.1 % (ref 37.5–51.0)
Hemoglobin: 15 g/dL (ref 13.0–17.7)
Immature Grans (Abs): 0 10*3/uL (ref 0.0–0.1)
Immature Granulocytes: 0 %
Lymphocytes Absolute: 2 10*3/uL (ref 0.7–3.1)
Lymphs: 32 %
MCH: 29.9 pg (ref 26.6–33.0)
MCHC: 33.3 g/dL (ref 31.5–35.7)
MCV: 90 fL (ref 79–97)
Monocytes Absolute: 0.5 10*3/uL (ref 0.1–0.9)
Monocytes: 8 %
Neutrophils Absolute: 3.7 10*3/uL (ref 1.4–7.0)
Neutrophils: 59 %
Platelets: 232 10*3/uL (ref 150–450)
RBC: 5.02 x10E6/uL (ref 4.14–5.80)
RDW: 13.8 % (ref 11.6–15.4)
WBC: 6.2 10*3/uL (ref 3.4–10.8)

## 2023-12-11 LAB — COMPREHENSIVE METABOLIC PANEL WITH GFR
ALT: 18 IU/L (ref 0–44)
AST: 24 IU/L (ref 0–40)
Albumin: 4.6 g/dL (ref 3.8–4.9)
Alkaline Phosphatase: 84 IU/L (ref 44–121)
BUN/Creatinine Ratio: 11 (ref 9–20)
BUN: 14 mg/dL (ref 6–24)
Bilirubin Total: 0.4 mg/dL (ref 0.0–1.2)
CO2: 20 mmol/L (ref 20–29)
Calcium: 9.6 mg/dL (ref 8.7–10.2)
Chloride: 104 mmol/L (ref 96–106)
Creatinine, Ser: 1.31 mg/dL — ABNORMAL HIGH (ref 0.76–1.27)
Globulin, Total: 2.7 g/dL (ref 1.5–4.5)
Glucose: 98 mg/dL (ref 70–99)
Potassium: 4.4 mmol/L (ref 3.5–5.2)
Sodium: 141 mmol/L (ref 134–144)
Total Protein: 7.3 g/dL (ref 6.0–8.5)
eGFR: 65 mL/min/{1.73_m2} (ref >59)

## 2023-12-11 LAB — LIPID PANEL
Chol/HDL Ratio: 4.6 ratio (ref 0.0–5.0)
Cholesterol, Total: 193 mg/dL (ref 100–199)
HDL: 42 mg/dL (ref >39)
LDL Chol Calc (NIH): 127 mg/dL — ABNORMAL HIGH (ref 0–99)
Triglycerides: 133 mg/dL (ref 0–149)
VLDL Cholesterol Cal: 24 mg/dL (ref 5–40)

## 2023-12-11 LAB — TESTOSTERONE: Testosterone: 518 ng/dL (ref 264–916)

## 2024-02-11 ENCOUNTER — Other Ambulatory Visit (INDEPENDENT_AMBULATORY_CARE_PROVIDER_SITE_OTHER)

## 2024-02-11 DIAGNOSIS — Z23 Encounter for immunization: Secondary | ICD-10-CM

## 2024-04-09 ENCOUNTER — Other Ambulatory Visit: Payer: Self-pay | Admitting: Cardiology

## 2024-04-09 DIAGNOSIS — I2782 Chronic pulmonary embolism: Secondary | ICD-10-CM

## 2024-04-09 NOTE — Telephone Encounter (Signed)
 Prescription refill request for Eliquis  received. Indication: PE Last office visit: 10/18/23  R Revankar MD Scr: 1.31 on 12/10/23  Epic Age: 53 Weight: 86.2kg  Based on above findings Eliquis  5mg  twice daily is the appropriate dose.  Refill approved.

## 2024-05-21 ENCOUNTER — Encounter: Payer: Self-pay | Admitting: Physician Assistant

## 2024-05-21 ENCOUNTER — Ambulatory Visit: Admitting: Physician Assistant

## 2024-05-21 ENCOUNTER — Telehealth: Payer: Self-pay | Admitting: *Deleted

## 2024-05-21 ENCOUNTER — Encounter: Payer: Self-pay | Admitting: Cardiology

## 2024-05-21 DIAGNOSIS — L408 Other psoriasis: Secondary | ICD-10-CM

## 2024-05-21 DIAGNOSIS — L400 Psoriasis vulgaris: Secondary | ICD-10-CM

## 2024-05-21 DIAGNOSIS — L4050 Arthropathic psoriasis, unspecified: Secondary | ICD-10-CM

## 2024-05-21 DIAGNOSIS — Z7189 Other specified counseling: Secondary | ICD-10-CM | POA: Diagnosis not present

## 2024-05-21 MED ORDER — BETAMETHASONE VALERATE 0.1 % EX OINT: 1.0000 | TOPICAL_OINTMENT | Freq: Two times a day (BID) | CUTANEOUS | 2 refills | Status: AC

## 2024-05-21 MED ORDER — BETAMETHASONE VALERATE 0.1 % EX OINT: 1.0000 | TOPICAL_OINTMENT | Freq: Two times a day (BID) | CUTANEOUS | 0 refills | Status: DC

## 2024-05-21 NOTE — Progress Notes (Signed)
   New Patient Visit   Subjective  Jonathan Archer is a 53 y.o. male NEW PATIENT who presents for the following: Psoriasis  [name] has scaly spots on his hands, back, arms, and legs. His PCP referred him for psoriasis but he has not officially been diagnosed yet. He is currently using hydrocortisone cream on the spots. Mostly complains of scaly plaques and itchiness. Does have severe joint involvement. Denies family history of psoriasis.    The following portions of the chart were reviewed this encounter and updated as appropriate: medications, allergies, medical history  Review of Systems:  No other skin or systemic complaints except as noted in HPI or Assessment and Plan.  Objective  Well appearing patient in no apparent distress; mood and affect are within normal limits.  Areas Examined: Full body skin exam.   Relevant exam findings are noted in the Assessment and Plan.      Assessment & Plan   PSORIATIC ARTHRITIS (HCC)   PSORIASIS VULGARIS   Related Procedures Acute Hep Panel & Hep B Surface Ab CBC with Differential/Platelets CMP QuantiFERON-TB Gold Plus  PSORIASIS/PSORIATIC ARTHRITIS  Well-demarcated erythematous papules/plaques with silvery scale, guttate pink scaly papules. 25% BSA.  flared  Patient has significant joint pain  Treatment Plan: -Discussed possible treatment of Skyrizi. Will check labs today -Betamethasone ointment to use everyday BID until clear -Will check with cardiologist to ensure they are okay with this plan  Counseling on psoriasis and coordination of care  psoriasis is a chronic non-curable, but treatable genetic/hereditary disease that may have other systemic features affecting other organ systems such as joints (Psoriatic Arthritis). It is associated with an increased risk of inflammatory bowel disease, heart disease, non-alcoholic fatty liver disease, and depression.  Treatments include light and laser treatments; topical medications;  and systemic medications including oral and injectables.  Pt is not a candidate for methotrexate or cyclosporine due to inability for follow up labs and contraindications with other medications. Pt has no access to a light box for phototherapy.  Due to the progressive and chronic nature of his psoriasis, and the fact that he has severe joint involvement, I think the best therapeutic option is a systemic therapy. It is medically necessary to help improve his quality of life.      Return in about 6 months (around 11/19/2024) for psoriasis.  I, Gordan Beams, CMA, am acting as scribe for Sanaya Gwilliam K, PA-C.   Documentation: I have reviewed the above documentation for accuracy and completeness, and I agree with the above.  Susie Ehresman K, PA-C

## 2024-05-21 NOTE — Patient Instructions (Signed)

## 2024-05-21 NOTE — Telephone Encounter (Signed)
 Confirmed with [place], Pharmacist that injection is safe to take while taking eliquis  and replied to patient's message.

## 2024-05-29 LAB — CBC WITH DIFFERENTIAL/PLATELET
Basophils Absolute: 0 x10E3/uL (ref 0.0–0.2)
Basos: 0 %
EOS (ABSOLUTE): 0 x10E3/uL (ref 0.0–0.4)
Eos: 1 %
Hematocrit: 43.8 % (ref 37.5–51.0)
Hemoglobin: 14.3 g/dL (ref 13.0–17.7)
Immature Grans (Abs): 0 x10E3/uL (ref 0.0–0.1)
Immature Granulocytes: 0 %
Lymphocytes Absolute: 2.1 x10E3/uL (ref 0.7–3.1)
Lymphs: 36 %
MCH: 29.9 pg (ref 26.6–33.0)
MCHC: 32.6 g/dL (ref 31.5–35.7)
MCV: 92 fL (ref 79–97)
Monocytes Absolute: 0.4 x10E3/uL (ref 0.1–0.9)
Monocytes: 7 %
Neutrophils Absolute: 3.3 x10E3/uL (ref 1.4–7.0)
Neutrophils: 56 %
Platelets: 239 x10E3/uL (ref 150–450)
RBC: 4.78 x10E6/uL (ref 4.14–5.80)
RDW: 13.4 % (ref 11.6–15.4)
WBC: 5.9 x10E3/uL (ref 3.4–10.8)

## 2024-05-29 LAB — QUANTIFERON-TB GOLD PLUS
QuantiFERON Mitogen Value: >10 [IU]/mL
QuantiFERON Nil Value: 1.02 [IU]/mL
QuantiFERON TB1 Ag Value: 0.64 [IU]/mL
QuantiFERON TB2 Ag Value: 0.67 [IU]/mL
QuantiFERON-TB Gold Plus: NEGATIVE

## 2024-05-29 LAB — COMPREHENSIVE METABOLIC PANEL WITH GFR
ALT: 17 IU/L (ref 0–44)
AST: 18 IU/L (ref 0–40)
Albumin: 4.7 g/dL (ref 3.8–4.9)
Alkaline Phosphatase: 67 IU/L (ref 47–123)
BUN/Creatinine Ratio: 7 — ABNORMAL LOW (ref 9–20)
BUN: 10 mg/dL (ref 6–24)
Bilirubin Total: 0.4 mg/dL (ref 0.0–1.2)
CO2: 24 mmol/L (ref 20–29)
Calcium: 9.5 mg/dL (ref 8.7–10.2)
Chloride: 102 mmol/L (ref 96–106)
Creatinine, Ser: 1.36 mg/dL — ABNORMAL HIGH (ref 0.76–1.27)
Globulin, Total: 2.3 g/dL (ref 1.5–4.5)
Glucose: 71 mg/dL (ref 70–99)
Potassium: 4.3 mmol/L (ref 3.5–5.2)
Sodium: 140 mmol/L (ref 134–144)
Total Protein: 7 g/dL (ref 6.0–8.5)
eGFR: 62 mL/min/1.73 (ref >59)

## 2024-05-29 LAB — ACUTE HEP PANEL AND HEP B SURFACE AB
Hep A IgM: NEGATIVE
Hep B C IgM: NEGATIVE
Hep C Virus Ab: NONREACTIVE
Hepatitis B Surf Ab Quant: <3.5 m[IU]/mL — ABNORMAL LOW
Hepatitis B Surface Ag: NEGATIVE

## 2024-06-02 ENCOUNTER — Ambulatory Visit: Payer: Self-pay | Admitting: Physician Assistant

## 2024-08-11 ENCOUNTER — Other Ambulatory Visit: Payer: Self-pay

## 2024-08-11 ENCOUNTER — Encounter (HOSPITAL_BASED_OUTPATIENT_CLINIC_OR_DEPARTMENT_OTHER): Payer: Self-pay

## 2024-08-11 ENCOUNTER — Emergency Department (HOSPITAL_BASED_OUTPATIENT_CLINIC_OR_DEPARTMENT_OTHER)

## 2024-08-11 ENCOUNTER — Other Ambulatory Visit: Payer: Self-pay | Admitting: Physician Assistant

## 2024-08-11 ENCOUNTER — Emergency Department (HOSPITAL_BASED_OUTPATIENT_CLINIC_OR_DEPARTMENT_OTHER)
Admission: EM | Admit: 2024-08-11 | Discharge: 2024-08-11 | Disposition: A | Discharge status: Home / Self Care | Attending: Emergency Medicine | Admitting: Emergency Medicine

## 2024-08-11 DIAGNOSIS — R0602 Shortness of breath: Secondary | ICD-10-CM | POA: Insufficient documentation

## 2024-08-11 DIAGNOSIS — R079 Chest pain, unspecified: Secondary | ICD-10-CM | POA: Insufficient documentation

## 2024-08-11 DIAGNOSIS — Z7901 Long term (current) use of anticoagulants: Secondary | ICD-10-CM | POA: Diagnosis not present

## 2024-08-11 LAB — BASIC METABOLIC PANEL WITH GFR
Anion gap: 12 (ref 5–15)
BUN: 16 mg/dL (ref 6–20)
CO2: 24 mmol/L (ref 22–32)
Calcium: 9.3 mg/dL (ref 8.9–10.3)
Chloride: 104 mmol/L (ref 98–111)
Creatinine, Ser: 1.24 mg/dL (ref 0.61–1.24)
GFR, Estimated: >60 mL/min
Glucose, Bld: 97 mg/dL (ref 70–99)
Potassium: 4.5 mmol/L (ref 3.5–5.1)
Sodium: 140 mmol/L (ref 135–145)

## 2024-08-11 LAB — CBC
HCT: 43.9 % (ref 39.0–52.0)
Hemoglobin: 15.5 g/dL (ref 13.0–17.0)
MCH: 30.7 pg (ref 26.0–34.0)
MCHC: 35.3 g/dL (ref 30.0–36.0)
MCV: 86.9 fL (ref 80.0–100.0)
Platelets: 225 K/uL (ref 150–400)
RBC: 5.05 MIL/uL (ref 4.22–5.81)
RDW: 12.7 % (ref 11.5–15.5)
WBC: 6.2 K/uL (ref 4.0–10.5)
nRBC: 0 % (ref 0.0–0.2)

## 2024-08-11 LAB — D-DIMER, QUANTITATIVE: D-Dimer, Quant: <0.27 ug{FEU}/mL (ref 0.00–0.50)

## 2024-08-11 LAB — TROPONIN T, HIGH SENSITIVITY
Troponin T High Sensitivity: <15 ng/L (ref 0–19)
Troponin T High Sensitivity: <15 ng/L (ref 0–19)

## 2024-08-11 MED ORDER — PANTOPRAZOLE SODIUM 40 MG IV SOLR
40.0000 mg | Freq: Once | INTRAVENOUS | Status: AC
  Administered 2024-08-11: 40 mg via INTRAVENOUS
  Filled 2024-08-11: qty 10

## 2024-08-11 MED ORDER — LIDOCAINE VISCOUS HCL 2 % MT SOLN
15.0000 mL | Freq: Once | OROMUCOSAL | Status: AC
  Administered 2024-08-11: 15 mL via ORAL
  Filled 2024-08-11: qty 15

## 2024-08-11 MED ORDER — TACROLIMUS 0.1 % EX OINT: TOPICAL_OINTMENT | Freq: Two times a day (BID) | CUTANEOUS | 2 refills | Status: DC

## 2024-08-11 MED ORDER — ALUM & MAG HYDROXIDE-SIMETH 200-200-20 MG/5ML PO SUSP
30.0000 mL | Freq: Once | ORAL | Status: AC
  Administered 2024-08-11: 30 mL via ORAL
  Filled 2024-08-11: qty 30

## 2024-08-11 NOTE — ED Triage Notes (Signed)
 Intermittent mid chest pain and pressure for 3 days. Denies SHOB, NV.   Also reports L calf tenderness and itching. BIL pedal pulses strong and equal   Hx of DVT's On eliquis 

## 2024-08-11 NOTE — Discharge Instructions (Addendum)
 Your workup today is reassuring. It is very unlikely that your pain is due to an issue in your heart.  Your cardiac enzyme (troponin) was normal today. Your EKG which is a measure of the heart's electrical activity and rhythm is normal today. These would both show abnormalities if you were having a heart attack.  The blood test to look for signs of significant blood clot was normal, very low concern for blood clot in your lungs at this time.  There were multiple chronic blood clots in your left leg.  Your ultrasound showed a possible new blood clot.  Please continue to take your Eliquis  twice daily as prescribed.  Your chest x-ray is normal today.  Your blood counts and electrolytes are normal today  Your blood pressure was elevated here today at 152/116. Aim to get 30 minutes of exercise daily and limit intake of processed/fast foods. Follow up with your PCP or cardiologist within the next month to discuss if you may need treatment for your blood pressure.   Please schedule follow-up appointment with your cardiologist within the next week for further monitoring of your symptoms.  Return to the ER if you have any severe worsening of your chest pain, significant shortness of breath, if you pass out, any other new or concerning symptoms

## 2024-08-11 NOTE — ED Provider Notes (Signed)
 " [facility] Provider Note   CSN: [id number] Arrival date & time: 08/11/24  1019     Patient presents with: Chest Pain and Leg Pain   Jonathan Archer is a 54 y.o. 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  Reports that there is some mild shortness of breath associated with the chest pain.  Sometimes this pain radiates into the left arm.  Pain is nonexertional, nonpleuritic.  Pain does not radiate to the back.  He also reports a itching sensation in his left calf.  Denies any injuries or leg swelling.  Denies any recent long plane or car rides, recent hospitalizations or surgeries.  Reports he has been mostly compliant with his Eliquis , reports missing about 5 doses of Eliquis  in the past 90 days.     Chest Pain Leg Pain      Prior to Admission medications  Medication Sig Start Date End Date Taking? Authorizing Provider  betamethasone  valerate ointment (VALISONE ) 0.1 % Apply 1 Application topically 2 (two) times daily. 05/21/24   Sandridge, Brenda K, PA-C  cetirizine (ZYRTEC) 10 MG tablet Take 10 mg by mouth daily.    [provider]  ELIQUIS  5 MG TABS tablet TAKE 1 TABLET BY MOUTH TWICE A DAY 04/09/24   Revankar, Rajan R, MD  Multiple Vitamin (MULTIVITAMIN) tablet Take 1 tablet by mouth daily.    [provider]  nitroGLYCERIN  (NITROSTAT ) 0.4 MG SL tablet Place 0.4 mg under the tongue every 5 (five) minutes as needed for chest pain.    [provider]  omeprazole (PRILOSEC) 20 MG capsule Take 20 mg by mouth daily.    [provider]  tacrolimus  (PROTOPIC ) 0.1 % ointment APPLY TOPICALLY 2 TIMES DAILY TO AFFECTED AREAS 08/11/24   Orman Erminio POUR, PA-C  terbinafine  (LAMISIL ) 250 MG tablet Take 1 tablet (250 mg total) by mouth daily. 12/10/23   Joyce Norleen BROCKS, MD     Allergies: Hydrocodone     Review of Systems  Cardiovascular:  Positive for chest pain.    Updated Vital Signs BP (!) 152/116   Pulse 64   Temp 97.8 F (36.6 C) (Oral)   Resp 17   SpO2 98%   Physical Exam Vitals and nursing note reviewed.  Constitutional:      General: He is not in acute distress.    Appearance: He is well-developed.  HENT:     Head: Normocephalic and atraumatic.  Eyes:     Conjunctiva/sclera: Conjunctivae normal.  Cardiovascular:     Rate and Rhythm: Normal rate and regular rhythm.     Heart sounds: No murmur heard.    Comments: 2+ pedal and radial pulses bilaterally Pulmonary:     Effort: Pulmonary effort is normal. No respiratory distress.     Breath sounds: Normal breath sounds.  Abdominal:     Palpations: Abdomen is soft.     Tenderness: There is no abdominal tenderness.  Musculoskeletal:        General: No swelling.     Cervical back: Neck supple.     Comments: Mild Left mid calf TTP, No lower extremity edema noted bilaterally   Skin:    General: Skin is warm and dry.     Capillary Refill: Capillary refill takes less than 2 seconds.  Neurological:     General: No focal  deficit present.     Mental Status: He is alert and oriented to person, place, and time.  Psychiatric:        Mood and Affect: Mood normal.     (all labs ordered are listed, but only abnormal results are displayed) Labs Reviewed  BASIC METABOLIC PANEL WITH GFR  CBC  D-DIMER, QUANTITATIVE  TROPONIN T, HIGH SENSITIVITY  TROPONIN T, HIGH SENSITIVITY    EKG: EKG Interpretation Date/Time:  Monday August 11 2024 10:26:46 EST Ventricular Rate:  60 PR Interval:  163 QRS Duration:  109 QT Interval:  405 QTC Calculation: 405 R Axis:   144  Text Interpretation: Sinus rhythm Right axis deviation Confirmed by Midge Golas ([id number]) on 08/11/2024 10:51:07 AM  Radiology: [facility] Chest 2 View Result Date: 08/11/2024 EXAM: 2 VIEW(S) XRAY OF THE CHEST 08/11/2024 11:53:00 AM  COMPARISON: PA and lateral radiographs of the chest dated 01/09/2023. CLINICAL HISTORY: chest pain chest pain chest pain chest pain chest pain chest pain chest pain chest pain chest pain FINDINGS: LUNGS AND PLEURA: No focal pulmonary opacity. No pleural effusion. No pneumothorax. HEART AND MEDIASTINUM: No acute abnormality of the cardiac and mediastinal silhouettes. BONES AND SOFT TISSUES: No acute osseous abnormality. IMPRESSION: 1. No acute cardiopulmonary abnormality. Electronically signed by: Evalene Coho MD 08/11/2024 12:18 PM EST RP Workstation: [id number]   US  Venous Img Lower  Left (DVT Study) Result Date: 08/11/2024 EXAM: ULTRASOUND DUPLEX OF THE LEFT LOWER EXTREMITY VEINS TECHNIQUE: Duplex ultrasound using B-mode/gray scaled imaging and Doppler spectral analysis and color flow was obtained of the deep venous structures of the left lower extremity. COMPARISON: US  lower extremity left venous 11/03/2019. CLINICAL HISTORY: left leg pain, history of dvt FINDINGS: There is nonocclusive chronic thromboembolic disease present within the left femoral and popliteal veins. There is questionable acute thrombus within the left posterior tibial vein. There is chronic nonocclusive thrombus within the left peroneal vein and distal popliteal vein. The common femoral vein demonstrates normal compressibility with normal color flow and spectral analysis. Other visualized deep veins not otherwise specified demonstrate normal compressibility with normal color flow and spectral analysis. IMPRESSION: 1. Questionable acute thrombus in the left posterior tibial vein. 2. 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 Electronically signed by: Evalene Coho MD 08/11/2024 12:12 PM EST RP Workstation: [id number]     Procedures   Medications Ordered in the ED  alum & mag hydroxide-simeth (MAALOX/MYLANTA) 200-200-20 MG/5ML suspension 30 mL (30 mLs Oral Given 08/11/24 1151)     And  lidocaine  (XYLOCAINE ) 2 % viscous mouth solution 15 mL (15 mLs Oral Given 08/11/24 1151)  pantoprazole  (PROTONIX ) injection 40 mg (40 mg Intravenous Given 08/11/24 1152)                                    Medical Decision Making Amount and/or Complexity of Data Reviewed Labs: ordered. Radiology: ordered.  Risk OTC drugs. Prescription drug management.     Differential diagnosis includes but is not limited to ACS, arrhythmia, aortic aneurysm, pericarditis, myocarditis, pericardial effusion, cardiac tamponade, musculoskeletal pain, GERD, Boerhaave's syndrome, DVT/PE, pneumonia, pleural effusion   ED Course:  Upon initial evaluation, patient is very well appearing, stable vitals aside from blood pressure of 162/99.  Regular rate and rhythm on cardiac auscultation.  No murmurs.  2+ radial and pedal pulses bilaterally.  No obvious edema or color change of the lower extremities to suggest DVT.  However, he is mildly tender over the left calf, given history of previous DVTs, will obtain ultrasound to further evaluate. Will d-dimer to see if CTA chest will be needed.   Labs Ordered: I Ordered, and personally interpreted labs.  The pertinent results include:   CBC and BMP within normal limits D-dimer within normal limits Initial troponin within normal limits, repeat within normal limits  Imaging Studies ordered: I ordered imaging studies including chest x-ray, ultrasound left lower extremity I independently visualized the imaging with scope of interpretation limited to determining acute life threatening conditions related to emergency care. Imaging showed  US  LLE: IMPRESSION:  1. Questionable acute thrombus in the left posterior tibial vein.  2. Chronic nonocclusive thromboembolic disease in the left femoral, popliteal,  and peroneal veins.   Chest x-ray without acute abnormality I agree with the radiologist interpretation   Cardiac Monitoring: / EKG: The patient was  maintained on a cardiac monitor.  I personally viewed and interpreted the cardiac monitored which showed an underlying rhythm of: Normal sinus rhythm  Medications Given: Protonix  Maalox  Upon re-evaluation, patient remains well-appearing with stable vitals.  Reports chest pain has still been intermittent even despite receiving Maalox and Protonix .    Low concern for ACS at this time given troponin remains stable with initial and repeat troponin within normal limits, pain non-exertional, and EKG with normal sinus rhythm and no ST changes. Chest x-ray without any acute abnormality. Low concern for PE at this time given d-dimer within normal limits, no tachycardia, no hypoxia, no pleuritic chest pain.   Given the pain in the left calf, ultrasound was obtained which did show chronic DVT and a questionable acute thrombus in the left posterior tibial vein.  Patient is already on anticoagulation with Eliquis , will have him continue taking his Eliquis . Unclear cause to patient's pain at this time. Patient also personally evaluated by my attending Dr. Midge who does not recommend CTA chest at this time given d dimer within normal limits and low clinical concern for PE.  Dr. Midge does not recommend any further observation or workup. He recommends discharge home with continuation of Eliquis .    Impression: Chest pain  Disposition:  The patient was discharged home with instructions to continue taking Eliquis  as prescribed.  Patient states he just received a refill, does not need any further refills. He understands to call to schedule a follow appointment with his cardiologist within the next week.  His blood pressure was elevated today, understands to follow-up with cardiology or PCP to recheck blood pressure within the next month. Return precautions given and patient verbalized understanding.    Record Review: External records from outside source obtained and reviewed including cardiology note  from 10/18/23     This chart was dictated using voice recognition software, Dragon. Despite the best efforts of this provider to proofread and correct errors, errors may still occur which can change documentation meaning.       Final diagnoses:  Chest pain, unspecified type    ED Discharge Orders     None          Veta Palma, DEVONNA 08/11/24 1356    Midge Golas, MD 08/11/24 1423  "

## 2024-08-12 ENCOUNTER — Other Ambulatory Visit: Payer: Self-pay

## 2024-08-12 ENCOUNTER — Ambulatory Visit: Attending: Cardiology | Admitting: Cardiology

## 2024-08-12 VITALS — BP 120/84 | HR 85 | Ht 70.0 in | Wt 188.4 lb

## 2024-08-12 DIAGNOSIS — E782 Mixed hyperlipidemia: Secondary | ICD-10-CM | POA: Diagnosis not present

## 2024-08-12 DIAGNOSIS — Z86711 Personal history of pulmonary embolism: Secondary | ICD-10-CM

## 2024-08-12 DIAGNOSIS — I209 Angina pectoris, unspecified: Secondary | ICD-10-CM | POA: Insufficient documentation

## 2024-08-12 DIAGNOSIS — I259 Chronic ischemic heart disease, unspecified: Secondary | ICD-10-CM

## 2024-08-12 DIAGNOSIS — I2782 Chronic pulmonary embolism: Secondary | ICD-10-CM

## 2024-08-12 MED ORDER — NITROGLYCERIN 0.4 MG SL SUBL: 0.4000 mg | SUBLINGUAL_TABLET | SUBLINGUAL | 6 refills | Status: AC | PRN

## 2024-08-12 MED ORDER — METOPROLOL TARTRATE 100 MG PO TABS: 100.0000 mg | ORAL_TABLET | Freq: Once | ORAL | 0 refills | Status: AC

## 2024-08-12 MED ORDER — APIXABAN 5 MG PO TABS: 5.0000 mg | ORAL_TABLET | Freq: Two times a day (BID) | ORAL | 3 refills | Status: AC

## 2024-08-12 NOTE — Progress Notes (Signed)
 " Cardiology Office Note:    Date:  08/12/2024   ID:  Jonathan Archer, DOB May 19, 1971, MRN [id number]  PCP:  Joyce Norleen BROCKS, MD  Cardiologist:  Jennifer JONELLE Crape, MD   Referring MD: Joyce Norleen BROCKS, MD    ASSESSMENT:    1. History of pulmonary embolism   2. Mixed dyslipidemia   3. Angina pectoris   4. Chest pain due to myocardial ischemia, unspecified ischemic chest pain type   5. Other chronic pulmonary embolism, unspecified whether acute cor pulmonale present (HCC)    PLAN:    In order of problems listed above:  Primary prevention stressed with the patient.  Importance of compliance with diet medication stressed and patient verbalized standing. History of DVT and pulmonary embolism: On anticoagulation.  Venous Doppler report was discussed with the patient at length.  This will be followed by primary care..  Patient is diligently taking anticoagulation. Angina pectoris: Symptoms are concerning.  The following recommendations were given.  Sublingual nitroglycerin  prescription was sent, its protocol and 911 protocol explained and the patient vocalized understanding questions were answered to the patient's satisfaction.  Will do a CT coronary angiography and is agreeable. Long-term anticoagulation: Benefits and risks of anticoagulation were discussed and questions were answered to satisfaction. Patient will be seen in follow-up appointment in 6 months or earlier if the patient has any concerns.    Medication Adjustments/Labs and Tests Ordered: Current medicines are reviewed at length with the patient today.  Concerns regarding medicines are outlined above.  No orders of the defined types were placed in this encounter.  No orders of the defined types were placed in this encounter.    No chief complaint on file.    History of Present Illness:    Jonathan Archer is a 54 y.o. male..  Patient has past medical history of DVT and pulmonary embolism.  He has been on anticoagulation for a  long time.  Now he complains of chest pain at times radiating to the neck and into the arms.  His calcium score and CT coronary angiography in the past many years ago was normal.  This concerned him and he has been to the emergency room.  I reviewed emergency room records.  At the time of my evaluation, the patient is alert awake oriented and in no distress.  Past Medical History:  Diagnosis Date   Allergic rhinitis due to pollen 01/12/2015   Allergy Not sure   Pollen   Arthritis of hip 09/14/2011   Atypical chest pain 06/24/2018   cardiologist-- dr crape;  work-up done , normal nuclear stress test 11-14-2021, normal echo 12-02-2019, and normal coronary CT with calcium score zero w/ no evidence cad cone 08-03-2019   Chronic pain of left knee    Current long-term use of anticoagulant medication with history of deep venous thrombosis (DVT)    (takes eliquis , managed by cardiology)  hx negative work-up for clotting disorder by dr lanny 05/ 2020//   recurrent first time LLE DVT 01/31/ 2007 provoked post op left arthroscopy w/ PE;  second time 10/ 04/ 2019 10 months post op left knee ACL/ MCL repairs LLE 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  (seen by vascular dr eliza ronco 12-18-2018)   Eczema    ED (erectile dysfunction)    Family history of heart disease in male family member before age 90 04/12/2018   GERD (gastroesophageal reflux disease)    History of Osgood-Schlatter disease  right lower extremity,  s/p bone removal in 1991   History of pulmonary embolism 06/24/2018   Hypercoagulability due to atrial fibrillation (HCC) 05/08/2018   Hypogonadism male    Incomplete RBBB    Mixed dyslipidemia 04/24/2018   OA (osteoarthritis)    left hip and right thumb   Wears glasses     Past Surgical History:  Procedure Laterality Date   ARTHROSCOPIC REPAIR ACL Left 06/2017   and MCL repair   KNEE ARTHROSCOPY Left    1996;  1998;  01/ 2007   KNEE ARTHROSCOPY WITH  DRILLING/MICROFRACTURE Left 05/09/2022   Procedure: KNEE ARTHROSCOPY WITH CHONDROPLASTY , MICROFRACTURE;  Surgeon: Gerome Charleston, MD;  Location: [facility];  Service: Orthopedics;  Laterality: Left;   KNEE SURGERY Right 1991   removal of bone (shin)    Current Medications: Active Medications[1]   Allergies:   Hydrocodone    Social History   Socioeconomic History   Marital status: Single    Spouse name: Not on file   Number of children: 3   Years of education: college   Highest education level: Not on file  Occupational History   Occupation: sales  Tobacco Use   Smoking status: Never   Smokeless tobacco: Never  Vaping Use   Vaping status: Never Used  Substance and Sexual Activity   Alcohol use: Not Currently    Comment: rarely   Drug use: Yes    Types: Marijuana    Comment: 05/07/2022   Sexual activity: Not on file  Other Topics Concern   Not on file  Social History Narrative   Lives at home with his son.   Left-sided.   1-2 cups of Pepsi per day.   Social Drivers of Health   Tobacco Use: Low Risk (08/12/2024)   Patient History    Smoking Tobacco Use: Never    Smokeless Tobacco Use: Never    Passive Exposure: Not on file  Financial Resource Strain: Not on file  Food Insecurity: Not on file  Transportation Needs: Not on file  Physical Activity: Not on file  Stress: Not on file  Social Connections: Not on file  Depression (PHQ2-9): Low Risk (12/10/2023)   Depression (PHQ2-9)    PHQ-2 Score: 4  Alcohol Screen: Not on file  Housing: Not on file  Utilities: Not on file  Health Literacy: Not on file     Family History: The patient's family history includes Clotting disorder in his father, maternal grandmother, and paternal grandmother; Diabetes in his sister; Heart attack in his father, mother, and sister; Heart disease in his father, mother, and sister; Stroke in his sister.  ROS:   Please see the history of present illness.    All other  systems reviewed and are negative.  EKGs/Labs/Other Studies Reviewed:    The following studies were reviewed today: I discussed my findings with the patient at length   Recent Labs: 05/23/2024: ALT 17 08/11/2024: BUN 16; Creatinine, Ser 1.24; Hemoglobin 15.5; Platelets 225; Potassium 4.5; Sodium 140  Recent Lipid Panel    Component Value Date/Time   CHOL 193 12/10/2023 0856   TRIG 133 12/10/2023 0856   HDL 42 12/10/2023 0856   CHOLHDL 4.6 12/10/2023 0856   CHOLHDL 3.6 01/12/2015 0001   VLDL 13 01/12/2015 0001   LDLCALC 127 (H) 12/10/2023 0856    Physical Exam:    VS:  BP 120/84   Pulse 85   Ht 5' 10 (1.778 m)   Wt 188 lb 6.4 oz (85.5  kg)   SpO2 98%   BMI 27.03 kg/m     Wt Readings from Last 3 Encounters:  08/12/24 188 lb 6.4 oz (85.5 kg)  12/10/23 191 lb 3.2 oz (86.7 kg)  10/18/23 190 lb 0.6 oz (86.2 kg)     GEN: Patient is in no acute distress HEENT: Normal NECK: No JVD; No carotid bruits LYMPHATICS: No lymphadenopathy CARDIAC: Hear sounds regular, 2/6 systolic murmur at the apex. RESPIRATORY:  Clear to auscultation without rales, wheezing or rhonchi  ABDOMEN: Soft, non-tender, non-distended MUSCULOSKELETAL:  No edema; No deformity  SKIN: Warm and dry NEUROLOGIC:  Alert and oriented x 3 PSYCHIATRIC:  Normal affect   Signed, Jennifer JONELLE Crape, MD  08/12/2024 3:37 PM    [facility]     [1]  Current Meds  Medication Sig   betamethasone  valerate ointment (VALISONE ) 0.1 % Apply 1 Application topically 2 (two) times daily.   cetirizine (ZYRTEC) 10 MG tablet Take 10 mg by mouth daily.   ELIQUIS  5 MG TABS tablet TAKE 1 TABLET BY MOUTH TWICE A DAY   Multiple Vitamin (MULTIVITAMIN) tablet Take 1 tablet by mouth daily.   nitroGLYCERIN  (NITROSTAT ) 0.4 MG SL tablet Place 0.4 mg under the tongue every 5 (five) minutes as needed for chest pain.   omeprazole (PRILOSEC) 20 MG capsule Take 20 mg by mouth daily.   tacrolimus  (PROTOPIC ) 0.1 %  ointment APPLY TOPICALLY 2 TIMES DAILY TO AFFECTED AREAS   terbinafine  (LAMISIL ) 250 MG tablet Take 1 tablet (250 mg total) by mouth daily.   "

## 2024-08-12 NOTE — Patient Instructions (Signed)
 "         Medication Instructions:  Your physician has recommended you make the following change in your medication:   Use nitroglycerin  1 tablet placed under the tongue at the first sign of chest pain or an angina attack. 1 tablet may be used every 5 minutes as needed, for up to 15 minutes. Do not take more than 3 tablets in 15 minutes. If pain persist call 911 or go to the nearest ED.   *If you need a refill on your cardiac medications before your next appointment, please call your pharmacy*   Lab Work: None ordered If you have labs (blood work) drawn today and your tests are completely normal, you will receive your results only by: MyChart Message (if you have MyChart) OR A paper copy in the mail If you have any lab test that is abnormal or we need to change your treatment, we will call you to review the results.   Testing/Procedures:   Your cardiac CT will be scheduled at one of the below locations:   [facility] [address] [place], [place] [address] [phone] (Severe contrast allergies only)  OR   [facility] [address] [place], [place] [address] [phone]  OR   [facility] [address] [place], [place] [address] [phone]  OR   [facility] [address]  [place], [place] [address]  OR   [facility] [address] [place], [place] [id number]  If scheduled at [facility], please arrive at the [facility] and Children's Entrance (Entrance C2) of [facility] 30 minutes prior to test start time. You can use the FREE valet parking offered at entrance C (encouraged to control the heart rate for the test)  Proceed to the [facility] Radiology Department (first floor) to check-in and test prep.  All radiology patients and guests should use entrance C2 at [facility], accessed from [facility], even though the hospital's  physical address listed is [address]  If scheduled at the Heart and Vascular Tower at [address], please enter the parking lot using the [address] entrance and use the FREE valet service at the patient drop-off area. Enter the building and check-in with registration on the main floor.  If scheduled at [facility], please arrive to the Heart and Vascular Center 15 mins early for check-in and test prep.  There is spacious parking and easy access to the radiology department from the [facility] Heart and Vascular entrance. Please enter here and check-in with the desk attendant.   If scheduled at [facility], please arrive 30 minutes early for check-in and test prep.  Please follow these instructions carefully (unless otherwise directed):  An IV will be required for this test and Nitroglycerin  will be given.  Hold all erectile dysfunction medications at least 3 days (72 hrs) prior to test. (Ie viagra , cialis, sildenafil , tadalafil, etc)   On the Night Before the Test: Be sure to Drink plenty of water. Do not consume any caffeinated/decaffeinated beverages or chocolate 12 hours prior to your test. Do not take any antihistamines 12 hours prior to your test.  If the patient has contrast allergy: Patient will need a prescription for Prednisone  and very clear instructions (as follows): Prednisone  50 mg - take 13 hours prior to test Take another Prednisone  50 mg 7 hours prior to test Take another Prednisone   50 mg 1 hour prior to test Take Benadryl 50 mg 1 hour prior to test Patient must complete all four doses of above prophylactic medications. Patient will need a ride after test due to Benadryl.  On the Day of the Test: Drink plenty of water until 1 hour prior to the test. Do not eat any food 1 hour prior to test. You may take your regular medications prior to the test.  Take metoprolol  (Lopressor ) two hours prior to test. If you take  Furosemide/Hydrochlorothiazide/Spironolactone/Chlorthalidone, please HOLD on the morning of the test. Patients who wear a continuous glucose monitor MUST remove the device prior to scanning. FEMALES- please wear underwire-free bra if available, avoid dresses & tight clothing  *For Clinical Staff only. Please instruct patient the following:* Heart Rate Medication Recommendations for Cardiac CT  Resting HR < 50 bpm  No medication  Resting HR 50-60 bpm and BP >110/50 mmHG   Consider Metoprolol  tartrate 25 mg PO 90-120 min prior to scan  Resting HR 60-65 bpm and BP >110/50 mmHG  Metoprolol  tartrate 50 mg PO 90-120 minutes prior to scan   Resting HR > 65 bpm and BP >110/50 mmHG  Metoprolol  tartrate 100 mg PO 90-120 minutes prior to scan  Consider Ivabradine 10-15 mg PO or a calcium channel blocker for resting HR >60 bpm and contraindication to metoprolol  tartrate  Consider Ivabradine 10-15 mg PO in combination with metoprolol  tartrate for HR >80 bpm        After the Test: Drink plenty of water. After receiving IV contrast, you may experience a mild flushed feeling. This is normal. On occasion, you may experience a mild rash up to 24 hours after the test. This is not dangerous. If this occurs, you can take Benadryl 25 mg, Zyrtec, Claritin, or Allegra and increase your fluid intake. (Patients taking Tikosyn should avoid Benadryl, and may take Zyrtec, Claritin, or Allegra) If you experience trouble breathing, this can be serious. If it is severe call 911 IMMEDIATELY. If it is mild, please call our office.  We will call to schedule your test 2-4 weeks out understanding that some insurance companies will need an authorization prior to the service being performed.   For more information and frequently asked questions, please visit our website : [email or web address]  For non-scheduling related questions, please contact the cardiac imaging nurse navigator should you have any  questions/concerns: Cardiac Imaging Nurse Navigators Direct Office Dial: [phone]   For scheduling needs, including cancellations and rescheduling, please call Brittany, [phone].    Follow-Up: At [facility], you and your health needs are our priority.  As part of our continuing mission to provide you with exceptional heart care, we have created designated Provider Care Teams.  These Care Teams include your primary Cardiologist (physician) and Advanced Practice Providers (APPs -  Physician Assistants and Nurse Practitioners) who all work together to provide you with the care you need, when you need it.  We recommend signing up for the patient portal called MyChart.  Sign up information is provided on this After Visit Summary.  MyChart is used to connect with patients for Virtual Visits (Telemedicine).  Patients are able to view lab/test results, encounter notes, upcoming appointments, etc.  Non-urgent messages can be sent to your provider as well.   To learn more about what you can do with MyChart, go to [email or web address].    Your next appointment:   9 month(s)  The format for your next appointment:   In Person  Provider:  Jennifer Crape, MD    Other Instructions none  Important Information About Sugar      "

## 2024-08-18 ENCOUNTER — Telehealth (HOSPITAL_COMMUNITY): Payer: Self-pay | Admitting: *Deleted

## 2024-08-18 NOTE — Telephone Encounter (Signed)
 Reaching out to patient to offer assistance regarding upcoming cardiac imaging study; pt verbalizes understanding of appt date/time, parking situation and where to check in, pre-test NPO status and medications ordered, and verified current allergies; name and call back number provided for further questions should they arise Sid Seats RN Navigator Cardiac Imaging [facility] [phone] office [phone] cell

## 2024-08-19 ENCOUNTER — Ambulatory Visit: Payer: Self-pay | Admitting: Cardiology

## 2024-08-19 ENCOUNTER — Ambulatory Visit (HOSPITAL_BASED_OUTPATIENT_CLINIC_OR_DEPARTMENT_OTHER)
Admission: RE | Admit: 2024-08-19 | Discharge: 2024-08-19 | Disposition: A | Discharge status: Home / Self Care | Source: Ambulatory Visit | Attending: Cardiology | Admitting: Cardiology

## 2024-08-19 DIAGNOSIS — I259 Chronic ischemic heart disease, unspecified: Secondary | ICD-10-CM | POA: Diagnosis present

## 2024-08-19 DIAGNOSIS — I209 Angina pectoris, unspecified: Secondary | ICD-10-CM | POA: Diagnosis present

## 2024-08-19 DIAGNOSIS — Z79899 Other long term (current) drug therapy: Secondary | ICD-10-CM

## 2024-08-19 DIAGNOSIS — E782 Mixed hyperlipidemia: Secondary | ICD-10-CM

## 2024-08-19 DIAGNOSIS — I25119 Atherosclerotic heart disease of native coronary artery with unspecified angina pectoris: Secondary | ICD-10-CM

## 2024-08-19 MED ORDER — NITROGLYCERIN 0.4 MG SL SUBL
0.8000 mg | SUBLINGUAL_TABLET | Freq: Once | SUBLINGUAL | Status: AC
  Administered 2024-08-19: 0.8 mg via SUBLINGUAL

## 2024-08-19 MED ORDER — IOHEXOL 350 MG/ML SOLN
100.0000 mL | Freq: Once | INTRAVENOUS | Status: AC | PRN
  Administered 2024-08-19: 95 mL via INTRAVENOUS

## 2024-08-23 LAB — HEPATIC FUNCTION PANEL
ALT: 20 [IU]/L (ref 0–44)
AST: 20 [IU]/L (ref 0–40)
Albumin: 4.6 g/dL (ref 3.8–4.9)
Alkaline Phosphatase: 74 [IU]/L (ref 47–123)
Bilirubin Total: 0.6 mg/dL (ref 0.0–1.2)
Bilirubin, Direct: 0.19 mg/dL (ref 0.00–0.40)
Total Protein: 7 g/dL (ref 6.0–8.5)

## 2024-08-23 LAB — LIPID PANEL
Chol/HDL Ratio: 3.8 ratio (ref 0.0–5.0)
Cholesterol, Total: 176 mg/dL (ref 100–199)
HDL: 46 mg/dL
LDL Chol Calc (NIH): 117 mg/dL — ABNORMAL HIGH (ref 0–99)
Triglycerides: 67 mg/dL (ref 0–149)
VLDL Cholesterol Cal: 13 mg/dL (ref 5–40)

## 2024-08-26 MED ORDER — ROSUVASTATIN CALCIUM 20 MG PO TABS: 20.0000 mg | ORAL_TABLET | Freq: Every day | ORAL | 3 refills | Status: AC

## 2024-11-19 ENCOUNTER — Ambulatory Visit: Admitting: Physician Assistant

## 2024-12-10 ENCOUNTER — Encounter: Payer: Self-pay | Admitting: Family Medicine
